# Patient Record
Sex: Male | Born: 1955 | Race: White | Hispanic: No | Marital: Married | Smoking: Former smoker
Health system: Southern US, Community
[De-identification: ages and names within clinical notes are randomized; demographics above are authoritative.]

## PROBLEM LIST (undated history)

## (undated) DIAGNOSIS — G8929 Other chronic pain: Secondary | ICD-10-CM

## (undated) DIAGNOSIS — G5602 Carpal tunnel syndrome, left upper limb: Secondary | ICD-10-CM

## (undated) DIAGNOSIS — M549 Dorsalgia, unspecified: Secondary | ICD-10-CM

## (undated) DIAGNOSIS — K429 Umbilical hernia without obstruction or gangrene: Secondary | ICD-10-CM

## (undated) DIAGNOSIS — R918 Other nonspecific abnormal finding of lung field: Secondary | ICD-10-CM

## (undated) HISTORY — PX: COLONOSCOPY: SHX174

## (undated) HISTORY — PX: INGUINAL HERNIA REPAIR: SUR1180

---

## 2016-07-08 ENCOUNTER — Other Ambulatory Visit: Payer: Self-pay | Admitting: Acute Care

## 2016-07-08 DIAGNOSIS — Z87891 Personal history of nicotine dependence: Secondary | ICD-10-CM

## 2016-08-10 ENCOUNTER — Ambulatory Visit (INDEPENDENT_AMBULATORY_CARE_PROVIDER_SITE_OTHER)
Admission: RE | Admit: 2016-08-10 | Discharge: 2016-08-10 | Disposition: A | Discharge status: Home / Self Care | Payer: Managed Care, Other (non HMO) | Source: Ambulatory Visit | Attending: Acute Care | Admitting: Acute Care

## 2016-08-10 ENCOUNTER — Encounter: Payer: Self-pay | Admitting: Acute Care

## 2016-08-10 ENCOUNTER — Ambulatory Visit (INDEPENDENT_AMBULATORY_CARE_PROVIDER_SITE_OTHER): Payer: Managed Care, Other (non HMO) | Admitting: Acute Care

## 2016-08-10 DIAGNOSIS — Z87891 Personal history of nicotine dependence: Secondary | ICD-10-CM

## 2016-08-10 NOTE — Progress Notes (Signed)
Shared Decision Making Visit Lung Cancer Screening Program [phone]([id number])   Eligibility:  Age 60 y.o.  Pack Years Smoking History Calculation 30 pack years (# packs/per year x # years smoked)  Recent History of coughing up blood  no  Unexplained weight loss? no ( >Than 15 pounds within the last 6 months )  Prior History Lung / other cancer no (Diagnosis within the last 5 years already requiring surveillance chest CT Scans).  Smoking Status Former Smoker  Former Smokers: Years since quit: 12 years  Quit Date: 2005  Visit Components:  Discussion included one or more decision making aids. yes  Discussion included risk/benefits of screening. yes  Discussion included potential follow up diagnostic testing for abnormal scans. yes  Discussion included meaning and risk of over diagnosis. yes  Discussion included meaning and risk of False Positives. yes  Discussion included meaning of total radiation exposure. yes  Counseling Included:  Importance of adherence to annual lung cancer LDCT screening. yes  Impact of comorbidities on ability to participate in the program. yes  Ability and willingness to under diagnostic treatment. yes  Smoking Cessation Counseling:  Current Smokers:   Discussed importance of smoking cessation. NA, former smoker  Information about tobacco cessation classes and interventions provided to patient.NA, former smoker  Patient provided with "ticket" for LDCT Scan. yes  Symptomatic Patient. no  Counseling  Diagnosis Code: Tobacco Use Z72.0  Asymptomatic Patient yes  Counseling Former smoker  Former Smokers:   Discussed the importance of maintaining cigarette abstinence. yes  Diagnosis Code: Personal History of Nicotine Dependence. I69.629Z87.891  Information about tobacco cessation classes and interventions provided to patient. Yes  Patient provided with "ticket" for LDCT Scan. yes  Written Order for Lung Cancer Screening with LDCT placed in  Epic. Yes (CT Chest Lung Cancer Screening Low Dose W/O CM) [id number] Z12.2-Screening of respiratory organs Z87.891-Personal history of nicotine dependence  I spent 20 minutes of face to face time with Mr. Jesus Ochoa discussing the risks and benefits of lung cancer screening. We viewed a power point together that explained in detail the above noted topics. We took the time to pause the power point at intervals to allow for questions to be asked and answered to ensure understanding. We discussed that he had taken the single most powerful action possible to decrease his risk of developing lung cancer when he quit smoking. I counseled him to remain smoke free, and to contact me if he ever had the desire to smoke again so that I can provide resources and tools to help support the effort to remain smoke free. We discussed the time and location of the scan, and that either Jerolyn Shinammy Wilson, CMA or I will call with the results within  24-48 hours of receiving them. He has my card and contact information in the event he needs to speak with me, in addition to a copy of the power point we reviewed as a resource. He verbalized understanding of all of the above and had no further questions upon leaving the office.    Bevelyn NgoSarah F Groce, NP 08/10/2016

## 2016-09-30 ENCOUNTER — Telehealth: Payer: Self-pay | Admitting: Acute Care

## 2016-09-30 DIAGNOSIS — Z87891 Personal history of nicotine dependence: Secondary | ICD-10-CM

## 2016-09-30 NOTE — Telephone Encounter (Signed)
I left a message for Mr. Jesus Ochoa on his cell phone per his request with the results of his low-dose screening CT his scan was read as a  Lung RADS 2: nodules that are benign in appearance and behavior with a very low likelihood of becoming a clinically active cancer due to size or lack of growth. Recommendation per radiology is for a repeat LDCT in 12 months. I explained that we would order and schedule his follow-up scan for August 2018. I also indicated to him that his scan did show signs of aortic atherosclerosis. I explained that this is been a very common finding with these scans. I also explained that due to the fact it is a non-gated exam degree and severity is unable to be determined. At his request I will fax the results to his primary care physician Dr. Catha GosselinKevin Little . Mr. Penne LashLeggett has my contact information in the event he has any further questions regarding his scan.

## 2016-11-09 ENCOUNTER — Ambulatory Visit: Payer: Self-pay | Admitting: Surgery

## 2016-11-09 DIAGNOSIS — K429 Umbilical hernia without obstruction or gangrene: Secondary | ICD-10-CM

## 2016-11-09 DIAGNOSIS — K219 Gastro-esophageal reflux disease without esophagitis: Secondary | ICD-10-CM | POA: Insufficient documentation

## 2016-11-09 DIAGNOSIS — M5416 Radiculopathy, lumbar region: Secondary | ICD-10-CM | POA: Insufficient documentation

## 2016-11-09 DIAGNOSIS — M6208 Separation of muscle (nontraumatic), other site: Secondary | ICD-10-CM

## 2016-11-09 DIAGNOSIS — K439 Ventral hernia without obstruction or gangrene: Secondary | ICD-10-CM | POA: Insufficient documentation

## 2016-11-09 NOTE — H&P (Signed)
Jesus ArgyleDonald Ochoa 11/09/2016 9:41 AM Location: [facility] Patient #: [id number] DOB: 1956-05-11 Married / Language: English / Race: White Male  Patient Care Team: Catha GosselinKevin Little, MD as PCP - General (Family Medicine) Karie SodaSteven Rithvik Orcutt, MD as Consulting Physician (General Surgery) Aletha HalimAndreas Runheim, MD as Consulting Physician (Neurology)   History of Present Illness Jesus Ochoa(Lavette Yankovich C. Valor Quaintance MD; 11/09/2016 10:35 AM) The patient is a 60 year old male who presents with an umbilical hernia. Note for "Umbilical hernia": Patient sent for surgical consultation at the request of Dr[facility] Cl[facility]e [facility]Little [facility]. Concern for umbilical hernia getting symptomatic.  Pleasant overweight active male. Comes in today with his wife. Has had a lump at his bellybutton for the past few years. Hasn't really gotten much larger but now it started to become more painful and uncomfortable. He take some chronic pain medications. Does moderate intense lifting with work. He thinks it may have gotten slightly larger. He recalls having a left inguinal hernia repair as a child but no other abdominal hernia surgeries. He can walk several miles for over an hour without difficulty. He is not smoked in over doesn't years. His wife quit then as well so no smokers at home. No history of skin infections or problems. No problems with constipation or urinary difficulty.  No personal nor family history of GI/colon cancer, inflammatory bowel disease, irritable bowel syndrome, allergy such as Celiac Sprue, dietary/dairy problems, colitis, ulcers nor gastritis. No recent sick contacts/gastroenteritis. No travel outside the country. No changes in diet. No dysphagia to solids or liquids. No significant heartburn or reflux. No hematochezia, hematemesis, coffee ground emesis. No evidence of prior gastric/peptic ulceration. No cardiac or pulmonary issues. No sleep apnea.   Other Problems [name]([name], CMA; 11/09/2016 9:41 AM) Back  Pain Inguinal Hernia  Past Surgical History [name]([name], CMA; 11/09/2016 9:41 AM) Open Inguinal Hernia Surgery Left.  Diagnostic Studies History [name]([name], CMA; 11/09/2016 9:41 AM) Colonoscopy 5-10 years ago  Allergies [name]([name], CMA; 11/09/2016 9:43 AM) Juliette AlcideODEINE  Medication History [name]([name], CMA; 11/09/2016 9:44 AM) Gabapentin (300MG  Tablet, Oral) Active. Hydrocodone-Acetaminophen (5-325MG  Tablet, Oral) Active. Duexis (800-26.6MG  Tablet, Oral) Active. Medications Reconciled  Social History [name]([name], CMA; 11/09/2016 9:41 AM) Alcohol use Occasional alcohol use. Caffeine use Coffee, Tea. No drug use Tobacco use Former smoker.  Family History [name]([name], CMA; 11/09/2016 9:41 AM) Arthritis Mother. Cerebrovascular Accident Father. Hypertension Mother.     Review of Systems (Armen Sherrine MaplesGlenn CMA; 11/09/2016 9:41 AM) General Not Present- Appetite Loss, Chills, Fatigue, Fever, Night Sweats, Weight Gain and Weight Loss. Skin Not Present- Change in Wart/Mole, Dryness, Hives, Jaundice, New Lesions, Non-Healing Wounds, Rash and Ulcer. HEENT Not Present- Earache, Hearing Loss, Hoarseness, Nose Bleed, Oral Ulcers, Ringing in the Ears, Seasonal Allergies, Sinus Pain, Sore Throat, Visual Disturbances, Wears glasses/contact lenses and Yellow Eyes. Respiratory Not Present- Bloody sputum, Chronic Cough, Difficulty Breathing, Snoring and Wheezing. Breast Not Present- Breast Mass, Breast Pain, Nipple Discharge and Skin Changes. Cardiovascular Not Present- Chest Pain, Difficulty Breathing Lying Down, Leg Cramps, Palpitations, Rapid Heart Rate, Shortness of Breath and Swelling of Extremities. Gastrointestinal Not Present- Abdominal Pain, Bloating, Bloody Stool, Change in Bowel Habits, Chronic diarrhea, Constipation, Difficulty Swallowing, Excessive gas, Gets full quickly at meals, Hemorrhoids, Indigestion, Nausea, Rectal Pain and Vomiting. Male Genitourinary Not Present-  Blood in Urine, Change in Urinary Stream, Frequency, Impotence, Nocturia, Painful Urination, Urgency and Urine Leakage. Musculoskeletal Present- Back Pain. Not Present- Joint Pain, Joint Stiffness, Muscle Pain, Muscle Weakness and Swelling of Extremities. Neurological  Not Present- Decreased Memory, Fainting, Headaches, Numbness, Seizures, Tingling, Tremor, Trouble walking and Weakness. Psychiatric Not Present- Anxiety, Bipolar, Change in Sleep Pattern, Depression, Fearful and Frequent crying. Endocrine Not Present- Cold Intolerance, Excessive Hunger, Hair Changes, Heat Intolerance, Hot flashes and New Diabetes. Hematology Not Present- Blood Thinners, Easy Bruising, Excessive bleeding, Gland problems, HIV and Persistent Infections.  Vitals ([name] CMA; 11/09/2016 9:42 AM) 11/09/2016 9:42 AM Weight: 199 lb Height: 71in Body Surface Area: 2.1 m Body Mass Index: 27.75 kg/m  Temp.: 98.67F  Pulse: 80 (Regular)  P.OX: 94% (Room air) BP: 160/90 (Sitting, Left Arm, Standard)      Physical Exam Jesus Sportsman MD; 11/09/2016 10:10 AM)  General Mental Status-Alert. General Appearance-Not in acute distress, Not Sickly. Orientation-Oriented X3. Hydration-Well hydrated. Voice-Normal.  Integumentary Global Assessment Upon inspection and palpation of skin surfaces of the - Axillae: non-tender, no inflammation or ulceration, no drainage. and Distribution of scalp and body hair is normal. General Characteristics Temperature - normal warmth is noted.  Head and Neck Head-normocephalic, atraumatic with no lesions or palpable masses. Face Global Assessment - atraumatic, no absence of expression. Neck Global Assessment - no abnormal movements, no bruit auscultated on the right, no bruit auscultated on the left, no decreased range of motion, non-tender. Trachea-midline. Thyroid Gland Characteristics - non-tender.  Eye Eyeball - Left-Extraocular movements  intact, No Nystagmus. Eyeball - Right-Extraocular movements intact, No Nystagmus. Cornea - Left-No Hazy. Cornea - Right-No Hazy. Sclera/Conjunctiva - Left-No scleral icterus, No Discharge. Sclera/Conjunctiva - Right-No scleral icterus, No Discharge. Pupil - Left-Direct reaction to light normal. Pupil - Right-Direct reaction to light normal.  ENMT Ears Pinna - Left - no drainage observed, no generalized tenderness observed. Right - no drainage observed, no generalized tenderness observed. Nose and Sinuses External Inspection of the Nose - no destructive lesion observed. Inspection of the nares - Left - quiet respiration. Right - quiet respiration. Mouth and Throat Lips - Upper Lip - no fissures observed, no pallor noted. Lower Lip - no fissures observed, no pallor noted. Nasopharynx - no discharge present. Oral Cavity/Oropharynx - Tongue - no dryness observed. Oral Mucosa - no cyanosis observed. Hypopharynx - no evidence of airway distress observed.  Chest and Lung Exam Inspection Movements - Normal and Symmetrical. Accessory muscles - No use of accessory muscles in breathing. Palpation Palpation of the chest reveals - Non-tender. Auscultation Breath sounds - Normal and Clear.  Cardiovascular Auscultation Rhythm - Regular. Murmurs & Other Heart Sounds - Auscultation of the heart reveals - No Murmurs and No Systolic Clicks.  Abdomen Inspection Inspection of the abdomen reveals - No Visible peristalsis and No Abnormal pulsations. Umbilicus - No Bleeding, No Urine drainage. Palpation/Percussion Palpation and Percussion of the abdomen reveal - Soft, Non Tender, No Rebound tenderness, No Rigidity (guarding) and No Cutaneous hyperesthesia. Note: Moderate supraumbilical diastases recti. 2-1/2 cm umbilical hernia reducible. Abdomen soft. Nontender, nondistended. No guarding. No other hernias  Male Genitourinary Sexual Maturity Tanner 5 - Adult hair pattern and Adult penile  size and shape. Note: High riding left testicle but no recurrent inguinal hernia. I felt a bulge in supine but no hernia standing with Valsalva in right groin. No inguinal hernias. Normal external genitalia. Epididymi, testes, and spermatic cords normal without any masses.  Peripheral Vascular Upper Extremity Inspection - Left - No Cyanotic nailbeds, Not Ischemic. Right - No Cyanotic nailbeds, Not Ischemic.  Neurologic Neurologic evaluation reveals -normal attention span and ability to concentrate, able to name objects and repeat phrases. Appropriate fund  of knowledge , normal sensation and normal coordination. Mental Status Affect - not angry, not paranoid. Cranial Nerves-Normal Bilaterally. Gait-Normal.  Neuropsychiatric Mental status exam performed with findings of-able to articulate well with normal speech/language, rate, volume and coherence, thought content normal with ability to perform basic computations and apply abstract reasoning and no evidence of hallucinations, delusions, obsessions or homicidal/suicidal ideation.  Musculoskeletal Global Assessment Spine, Ribs and Pelvis - no instability, subluxation or laxity. Right Upper Extremity - no instability, subluxation or laxity.  Lymphatic Head & Neck  General Head & Neck Lymphatics: Bilateral - Description - No Localized lymphadenopathy. Axillary  General Axillary Region: Bilateral - Description - No Localized lymphadenopathy. Femoral & Inguinal  Generalized Femoral & Inguinal Lymphatics: Left - Description - No Localized lymphadenopathy. Right - Description - No Localized lymphadenopathy.    Assessment & Plan Jesus Ochoa(Hazael Olveda C. Tayla Panozzo MD; 11/09/2016 10:09 AM)  UMBILICAL HERNIA WITHOUT OBSTRUCTION AND WITHOUT GANGRENE (K42.9) Impression: To have by 2 cm umbilical hernia with moderate diastases recti.  I think he would benefit from hernia repair. We do have laparoscopic underlay repair with mesh given the moderate size,  diastases recti, and significant weight lifting requirements. A more durable repair.  Discussed with the patient and his wife. They're interesting proceeding electively.  DIASTASIS RECTI (M62.08)  Current Plans Pt Education - [facility] Diastasis Recti: discussed with patient and provided information. PREOP - [facility] - ENCOUNTER FOR PREOPERATIVE EXAMINATION FOR GENERAL SURGICAL PROCEDURE (Z01.818)  Current Plans You are being scheduled for surgery - Our schedulers will call you.  You should hear from our office's scheduling department within 5 working days about the location, date, and time of surgery. We try to make accommodations for patient's preferences in scheduling surgery, but sometimes the OR schedule or the surgeon's schedule prevents us from making those accommodations.  If you have not heard from our office [phone]) in 5 working days, call the office and ask for your surgeon's nurse.  If you have other questions about your diagnosis, plan, or surgery, call the office and ask for your surgeon's nurse.  Written instructions provided The anatomy & physiology of the abdominal wall was discussed. The pathophysiology of hernias was discussed. Natural history risks without surgery including progeressive enlargement, pain, incarceration, & strangulation was discussed. Contributors to complications such as smoking, obesity, diabetes, prior surgery, etc were discussed.  I feel the risks of no intervention will lead to serious problems that outweigh the operative risks; therefore, I recommended surgery to reduce and repair the hernia. I explained laparoscopic techniques with possible need for an open approach. I noted the probable use of mesh to patch and/or buttress the hernia repair  Risks such as bleeding, infection, abscess, need for further treatment, heart attack, death, and other risks were discussed. I noted a good likelihood this will help address the problem. Goals of post-operative  recovery were discussed as well. Possibility that this will not correct all symptoms was explained. I stressed the importance of low-impact activity, aggressive pain control, avoiding constipation, & not pushing through pain to minimize risk of post-operative chronic pain or injury. Possibility of reherniation especially with smoking, obesity, diabetes, immunosuppression, and other health conditions was discussed. We will work to minimize complications.  An educational handout further explaining the pathology & treatment options was given as well. Questions were answered. The patient expresses understanding & wishes to proceed with surgery.  Pt Education - [facility] Hernia Post-Op HCI (Elianis Fischbach): discussed with patient and provided information. Pt Education - [facility] Pain Control (  Eann Cleland) Pt Education - Pamphlet Given - Laparoscopic Hernia Repair: discussed with patient and provided information.  Jesus Ochoa, M.D., F.A.C.S. Gastrointestinal and Minimally Invasive Surgery [facility] [address] [place], [place] [address] [phone] Main / Paging

## 2016-12-29 ENCOUNTER — Encounter (HOSPITAL_BASED_OUTPATIENT_CLINIC_OR_DEPARTMENT_OTHER): Payer: Self-pay | Admitting: *Deleted

## 2016-12-29 NOTE — Progress Notes (Signed)
SPOKE W/ PT'S WIFE.  NPO AFTER MN.  ARRIVE AT 0800.  NEEDS HG.  WILL DO HIBICLENS SHOWER HS BEFORE AND AM DOS W/ SIPS OF WATER.

## 2016-12-31 ENCOUNTER — Ambulatory Visit (HOSPITAL_BASED_OUTPATIENT_CLINIC_OR_DEPARTMENT_OTHER): Payer: Managed Care, Other (non HMO) | Admitting: Anesthesiology

## 2016-12-31 ENCOUNTER — Ambulatory Visit (HOSPITAL_BASED_OUTPATIENT_CLINIC_OR_DEPARTMENT_OTHER)
Admission: RE | Admit: 2016-12-31 | Discharge: 2016-12-31 | Disposition: A | Discharge status: Home / Self Care | Payer: Managed Care, Other (non HMO) | Source: Ambulatory Visit | Attending: Surgery | Admitting: Surgery

## 2016-12-31 ENCOUNTER — Encounter (HOSPITAL_BASED_OUTPATIENT_CLINIC_OR_DEPARTMENT_OTHER): Payer: Self-pay | Admitting: Anesthesiology

## 2016-12-31 ENCOUNTER — Encounter (HOSPITAL_BASED_OUTPATIENT_CLINIC_OR_DEPARTMENT_OTHER)
Admission: RE | Disposition: A | Discharge status: Home / Self Care | Payer: Self-pay | Source: Ambulatory Visit | Attending: Surgery

## 2016-12-31 DIAGNOSIS — E663 Overweight: Secondary | ICD-10-CM | POA: Insufficient documentation

## 2016-12-31 DIAGNOSIS — Z6824 Body mass index (BMI) 24.0-24.9, adult: Secondary | ICD-10-CM | POA: Diagnosis not present

## 2016-12-31 DIAGNOSIS — K429 Umbilical hernia without obstruction or gangrene: Secondary | ICD-10-CM | POA: Insufficient documentation

## 2016-12-31 DIAGNOSIS — Z87891 Personal history of nicotine dependence: Secondary | ICD-10-CM | POA: Diagnosis not present

## 2016-12-31 DIAGNOSIS — K439 Ventral hernia without obstruction or gangrene: Secondary | ICD-10-CM | POA: Diagnosis present

## 2016-12-31 HISTORY — DX: Carpal tunnel syndrome, left upper limb: G56.02

## 2016-12-31 HISTORY — DX: Umbilical hernia without obstruction or gangrene: K42.9

## 2016-12-31 HISTORY — PX: INSERTION OF MESH: SHX5868

## 2016-12-31 HISTORY — DX: Other nonspecific abnormal finding of lung field: R91.8

## 2016-12-31 HISTORY — DX: Other chronic pain: G89.29

## 2016-12-31 HISTORY — PX: VENTRAL HERNIA REPAIR: SHX424

## 2016-12-31 HISTORY — DX: Dorsalgia, unspecified: M54.9

## 2016-12-31 LAB — POCT HEMOGLOBIN-HEMACUE: HEMOGLOBIN: 14.7 g/dL (ref 13.0–17.0)

## 2016-12-31 SURGERY — REPAIR, HERNIA, VENTRAL, LAPAROSCOPIC
Anesthesia: General | Site: Abdomen

## 2016-12-31 MED ORDER — CELECOXIB 200 MG PO CAPS
ORAL_CAPSULE | ORAL | Status: AC
  Filled 2016-12-31: qty 2

## 2016-12-31 MED ORDER — CEFAZOLIN SODIUM-DEXTROSE 2-4 GM/100ML-% IV SOLN
2.0000 g | INTRAVENOUS | Status: AC
  Administered 2016-12-31: 2 g via INTRAVENOUS
  Filled 2016-12-31: qty 100

## 2016-12-31 MED ORDER — OXYCODONE HCL 5 MG PO TABS: 5.0000 mg | ORAL_TABLET | ORAL | 0 refills | Status: AC | PRN

## 2016-12-31 MED ORDER — EPHEDRINE SULFATE-NACL 50-0.9 MG/10ML-% IV SOSY
PREFILLED_SYRINGE | INTRAVENOUS | Status: DC | PRN
  Administered 2016-12-31 (×2): 10 mg via INTRAVENOUS

## 2016-12-31 MED ORDER — EPINEPHRINE PF 1 MG/ML IJ SOLN
INTRAMUSCULAR | Status: AC
  Filled 2016-12-31: qty 1

## 2016-12-31 MED ORDER — OXYCODONE HCL 5 MG PO TABS
5.0000 mg | ORAL_TABLET | Freq: Once | ORAL | Status: AC
  Administered 2016-12-31: 5 mg via ORAL
  Filled 2016-12-31: qty 1

## 2016-12-31 MED ORDER — DEXAMETHASONE SODIUM PHOSPHATE 4 MG/ML IJ SOLN
INTRAMUSCULAR | Status: DC | PRN
  Administered 2016-12-31: 10 mg via INTRAVENOUS

## 2016-12-31 MED ORDER — GABAPENTIN 300 MG PO CAPS
ORAL_CAPSULE | ORAL | Status: AC
  Filled 2016-12-31: qty 1

## 2016-12-31 MED ORDER — SUCCINYLCHOLINE CHLORIDE 200 MG/10ML IV SOSY
PREFILLED_SYRINGE | INTRAVENOUS | Status: AC
  Filled 2016-12-31: qty 10

## 2016-12-31 MED ORDER — LIDOCAINE 2% (20 MG/ML) 5 ML SYRINGE
INTRAMUSCULAR | Status: AC
  Filled 2016-12-31: qty 5

## 2016-12-31 MED ORDER — SUGAMMADEX SODIUM 200 MG/2ML IV SOLN
INTRAVENOUS | Status: AC
  Filled 2016-12-31: qty 2

## 2016-12-31 MED ORDER — FENTANYL CITRATE (PF) 100 MCG/2ML IJ SOLN
INTRAMUSCULAR | Status: DC | PRN
  Administered 2016-12-31 (×4): 50 ug via INTRAVENOUS

## 2016-12-31 MED ORDER — HYDROMORPHONE HCL 1 MG/ML IJ SOLN
0.2500 mg | INTRAMUSCULAR | Status: DC | PRN
  Filled 2016-12-31: qty 0.5

## 2016-12-31 MED ORDER — SODIUM CHLORIDE 0.9 % IJ SOLN
INTRAMUSCULAR | Status: AC
  Filled 2016-12-31: qty 50

## 2016-12-31 MED ORDER — ROCURONIUM BROMIDE 50 MG/5ML IV SOSY
PREFILLED_SYRINGE | INTRAVENOUS | Status: DC | PRN
  Administered 2016-12-31: 50 mg via INTRAVENOUS

## 2016-12-31 MED ORDER — BUPIVACAINE-EPINEPHRINE 0.25% -1:200000 IJ SOLN
INTRAMUSCULAR | Status: DC | PRN
  Administered 2016-12-31: 10 mL
  Administered 2016-12-31: 20 mL

## 2016-12-31 MED ORDER — SUGAMMADEX SODIUM 200 MG/2ML IV SOLN
INTRAVENOUS | Status: DC | PRN
  Administered 2016-12-31: 200 mg via INTRAVENOUS

## 2016-12-31 MED ORDER — MEPERIDINE HCL 25 MG/ML IJ SOLN
6.2500 mg | INTRAMUSCULAR | Status: DC | PRN
  Filled 2016-12-31: qty 1

## 2016-12-31 MED ORDER — FENTANYL CITRATE (PF) 100 MCG/2ML IJ SOLN
INTRAMUSCULAR | Status: AC
  Filled 2016-12-31: qty 2

## 2016-12-31 MED ORDER — PROMETHAZINE HCL 25 MG/ML IJ SOLN
6.2500 mg | INTRAMUSCULAR | Status: DC | PRN
  Filled 2016-12-31: qty 1

## 2016-12-31 MED ORDER — DEXAMETHASONE SODIUM PHOSPHATE 10 MG/ML IJ SOLN
INTRAMUSCULAR | Status: AC
  Filled 2016-12-31: qty 1

## 2016-12-31 MED ORDER — ACETAMINOPHEN 500 MG PO TABS
1000.0000 mg | ORAL_TABLET | ORAL | Status: AC
  Administered 2016-12-31: 1000 mg via ORAL
  Filled 2016-12-31: qty 2

## 2016-12-31 MED ORDER — MIDAZOLAM HCL 2 MG/2ML IJ SOLN
INTRAMUSCULAR | Status: AC
  Filled 2016-12-31: qty 2

## 2016-12-31 MED ORDER — ACETAMINOPHEN 500 MG PO TABS
ORAL_TABLET | ORAL | Status: AC
  Filled 2016-12-31: qty 2

## 2016-12-31 MED ORDER — HYDROCODONE-ACETAMINOPHEN 7.5-325 MG PO TABS
1.0000 | ORAL_TABLET | Freq: Once | ORAL | Status: DC | PRN
Start: 2016-12-31 — End: 2016-12-31
  Filled 2016-12-31: qty 1

## 2016-12-31 MED ORDER — GABAPENTIN 300 MG PO CAPS
300.0000 mg | ORAL_CAPSULE | ORAL | Status: AC
  Administered 2016-12-31: 300 mg via ORAL
  Filled 2016-12-31: qty 1

## 2016-12-31 MED ORDER — MIDAZOLAM HCL 5 MG/5ML IJ SOLN
INTRAMUSCULAR | Status: DC | PRN
  Administered 2016-12-31: 2 mg via INTRAVENOUS

## 2016-12-31 MED ORDER — EPHEDRINE 5 MG/ML INJ
INTRAVENOUS | Status: AC
Start: 2016-12-31 — End: 2016-12-31
  Filled 2016-12-31: qty 10

## 2016-12-31 MED ORDER — ROCURONIUM BROMIDE 50 MG/5ML IV SOSY
PREFILLED_SYRINGE | INTRAVENOUS | Status: AC
  Filled 2016-12-31: qty 5

## 2016-12-31 MED ORDER — BUPIVACAINE LIPOSOME 1.3 % IJ SUSP
INTRAMUSCULAR | Status: AC
Start: 2016-12-31 — End: 2016-12-31
  Filled 2016-12-31: qty 20

## 2016-12-31 MED ORDER — CELECOXIB 400 MG PO CAPS
400.0000 mg | ORAL_CAPSULE | ORAL | Status: AC
  Administered 2016-12-31: 400 mg via ORAL
  Filled 2016-12-31: qty 1

## 2016-12-31 MED ORDER — CHLORHEXIDINE GLUCONATE CLOTH 2 % EX PADS
6.0000 | MEDICATED_PAD | Freq: Once | CUTANEOUS | Status: DC
  Filled 2016-12-31: qty 6

## 2016-12-31 MED ORDER — ARTIFICIAL TEARS OP OINT
TOPICAL_OINTMENT | OPHTHALMIC | Status: AC
  Filled 2016-12-31: qty 3.5

## 2016-12-31 MED ORDER — OXYCODONE HCL 5 MG PO TABS
ORAL_TABLET | ORAL | Status: AC
  Filled 2016-12-31: qty 1

## 2016-12-31 MED ORDER — ONDANSETRON HCL 4 MG/2ML IJ SOLN
INTRAMUSCULAR | Status: AC
  Filled 2016-12-31: qty 2

## 2016-12-31 MED ORDER — CEFAZOLIN SODIUM-DEXTROSE 2-4 GM/100ML-% IV SOLN
INTRAVENOUS | Status: AC
  Filled 2016-12-31: qty 100

## 2016-12-31 MED ORDER — BUPIVACAINE HCL (PF) 0.25 % IJ SOLN
INTRAMUSCULAR | Status: AC
  Filled 2016-12-31: qty 60

## 2016-12-31 MED ORDER — PROPOFOL 10 MG/ML IV BOLUS
INTRAVENOUS | Status: DC | PRN
  Administered 2016-12-31: 200 mg via INTRAVENOUS

## 2016-12-31 MED ORDER — LIDOCAINE HCL (CARDIAC) 20 MG/ML IV SOLN
INTRAVENOUS | Status: DC | PRN
  Administered 2016-12-31: 60 mg via INTRAVENOUS

## 2016-12-31 MED ORDER — PROPOFOL 10 MG/ML IV BOLUS
INTRAVENOUS | Status: AC
  Filled 2016-12-31: qty 40

## 2016-12-31 MED ORDER — ONDANSETRON HCL 4 MG/2ML IJ SOLN
INTRAMUSCULAR | Status: DC | PRN
  Administered 2016-12-31: 4 mg via INTRAVENOUS

## 2016-12-31 MED ORDER — KETOROLAC TROMETHAMINE 30 MG/ML IJ SOLN
INTRAMUSCULAR | Status: AC
  Filled 2016-12-31: qty 1

## 2016-12-31 MED ORDER — LIDOCAINE HCL 4 % MT SOLN
OROMUCOSAL | Status: DC | PRN
  Administered 2016-12-31: 2 mL via TOPICAL

## 2016-12-31 MED ORDER — SODIUM CHLORIDE 0.9 % IJ SOLN
INTRAMUSCULAR | Status: DC | PRN
  Administered 2016-12-31: 40 mL

## 2016-12-31 MED ORDER — BUPIVACAINE LIPOSOME 1.3 % IJ SUSP
INTRAMUSCULAR | Status: DC | PRN
  Administered 2016-12-31: 20 mL

## 2016-12-31 MED ORDER — LACTATED RINGERS IV SOLN
INTRAVENOUS | Status: DC
  Administered 2016-12-31 (×2): via INTRAVENOUS
  Filled 2016-12-31: qty 1000

## 2016-12-31 SURGICAL SUPPLY — 63 items
APPLIER CLIP 5 13 M/L LIGAMAX5 (MISCELLANEOUS)
BINDER ABDOMINAL 12 ML 46-62 (SOFTGOODS) ×4 IMPLANT
BLADE SURG 11 STRL SS (BLADE) ×4 IMPLANT
CABLE HIGH FREQUENCY MONO STRZ (ELECTRODE) ×4 IMPLANT
CANISTER SUCTION 2500CC (MISCELLANEOUS) IMPLANT
CHLORAPREP W/TINT 26ML (MISCELLANEOUS) ×4 IMPLANT
CLIP APPLIE 5 13 M/L LIGAMAX5 (MISCELLANEOUS) IMPLANT
CLOSURE WOUND 1/2 X4 (GAUZE/BANDAGES/DRESSINGS) ×1
COVER BACK TABLE 60X90IN (DRAPES) ×4 IMPLANT
COVER MAYO STAND STRL (DRAPES) ×4 IMPLANT
DECANTER SPIKE VIAL GLASS SM (MISCELLANEOUS) IMPLANT
DEVICE SECURE STRAP 25 ABSORB (INSTRUMENTS) ×4 IMPLANT
DEVICE TROCAR PUNCTURE CLOSURE (ENDOMECHANICALS) ×4 IMPLANT
DRAPE LAPAROSCOPIC ABDOMINAL (DRAPES) ×4 IMPLANT
DRAPE UTILITY XL STRL (DRAPES) ×4 IMPLANT
DRAPE WARM FLUID 44X44 (DRAPE) ×4 IMPLANT
DRSG TEGADERM 2-3/8X2-3/4 SM (GAUZE/BANDAGES/DRESSINGS) ×12 IMPLANT
DRSG TEGADERM 4X4.75 (GAUZE/BANDAGES/DRESSINGS) ×4 IMPLANT
ELECT REM PT RETURN 9FT ADLT (ELECTROSURGICAL) ×4
ELECTRODE REM PT RTRN 9FT ADLT (ELECTROSURGICAL) ×2 IMPLANT
GLOVE BIO SURGEON STRL SZ 6.5 (GLOVE) ×3 IMPLANT
GLOVE BIO SURGEONS STRL SZ 6.5 (GLOVE) ×1
GLOVE BIOGEL PI IND STRL 6.5 (GLOVE) ×2 IMPLANT
GLOVE BIOGEL PI IND STRL 7.0 (GLOVE) ×2 IMPLANT
GLOVE BIOGEL PI IND STRL 7.5 (GLOVE) ×4 IMPLANT
GLOVE BIOGEL PI INDICATOR 6.5 (GLOVE) ×2
GLOVE BIOGEL PI INDICATOR 7.0 (GLOVE) ×2
GLOVE BIOGEL PI INDICATOR 7.5 (GLOVE) ×4
GLOVE ECLIPSE 8.0 STRL XLNG CF (GLOVE) ×4 IMPLANT
GLOVE INDICATOR 8.0 STRL GRN (GLOVE) IMPLANT
GOWN STRL REUS W/TWL XL LVL3 (GOWN DISPOSABLE) ×12 IMPLANT
IRRIG SUCT STRYKERFLOW 2 WTIP (MISCELLANEOUS)
IRRIGATION SUCT STRKRFLW 2 WTP (MISCELLANEOUS) IMPLANT
KIT ROOM TURNOVER WOR (KITS) ×4 IMPLANT
MARKER SKIN DUAL TIP RULER LAB (MISCELLANEOUS) IMPLANT
MESH VENTRALIGHT ST 6X8 (Mesh Specialty) ×2 IMPLANT
MESH VENTRLGHT ELLIPSE 8X6XMFL (Mesh Specialty) ×2 IMPLANT
NEEDLE HYPO 22GX1.5 SAFETY (NEEDLE) ×4 IMPLANT
NEEDLE SPNL 22GX3.5 QUINCKE BK (NEEDLE) ×4 IMPLANT
NS IRRIG 500ML POUR BTL (IV SOLUTION) ×4 IMPLANT
PACK BASIN DAY SURGERY FS (CUSTOM PROCEDURE TRAY) ×4 IMPLANT
PAD POSITIONING PINK XL (MISCELLANEOUS) ×4 IMPLANT
SCISSORS LAP 5X35 DISP (ENDOMECHANICALS) ×4 IMPLANT
SET IRRIG TUBING LAPAROSCOPIC (IRRIGATION / IRRIGATOR) IMPLANT
SHEARS HARMONIC ACE PLUS 36CM (ENDOMECHANICALS) IMPLANT
SLEEVE ADV FIXATION 5X100MM (TROCAR) IMPLANT
SPONGE GAUZE 2X2 8PLY STER LF (GAUZE/BANDAGES/DRESSINGS) ×1
SPONGE GAUZE 2X2 8PLY STRL LF (GAUZE/BANDAGES/DRESSINGS) ×3 IMPLANT
STRIP CLOSURE SKIN 1/2X4 (GAUZE/BANDAGES/DRESSINGS) ×3 IMPLANT
SUT MNCRL AB 4-0 PS2 18 (SUTURE) ×4 IMPLANT
SUT PDS AB 1 CT1 27 (SUTURE) ×12 IMPLANT
SUT PROLENE 1 CT 1 30 (SUTURE) ×24 IMPLANT
SUT VIC AB 2-0 SH 27 (SUTURE)
SUT VIC AB 2-0 SH 27X BRD (SUTURE) IMPLANT
SYR 20CC LL (SYRINGE) ×8 IMPLANT
TACKER 5MM HERNIA 3.5CML NAB (ENDOMECHANICALS) IMPLANT
TOWEL OR 17X24 6PK STRL BLUE (TOWEL DISPOSABLE) ×8 IMPLANT
TROCAR BLADELESS OPT 5 100 (ENDOMECHANICALS) ×12 IMPLANT
TROCAR XCEL NON-BLD 11X100MML (ENDOMECHANICALS) IMPLANT
TUBE CONNECTING 12'X1/4 (SUCTIONS)
TUBE CONNECTING 12X1/4 (SUCTIONS) IMPLANT
TUBING INSUF HEATED (TUBING) ×4 IMPLANT
WATER STERILE IRR 500ML POUR (IV SOLUTION) ×4 IMPLANT

## 2016-12-31 NOTE — Interval H&P Note (Signed)
History and Physical Interval Note:  12/31/2016 9:38 AM  Jesus Ochoa  has presented today for surgery, with the diagnosis of UMBILICAL VENTRAL WALL HERNIA  The various methods of treatment have been discussed with the patient and family. After consideration of risks, benefits and other options for treatment, the patient has consented to  Procedure(s): LAPAROSCOPIC UMBILICAL / VENTRAL WALL HERNIA WITH MESH/ (N/A) INSERTION OF MESH (N/A) as a surgical intervention .  The patient's history has been reviewed, patient examined, no change in status, stable for surgery.  I have reviewed the patient's chart and labs.  Questions were answered to the patient's satisfaction.     Maree Ainley C.

## 2016-12-31 NOTE — H&P (Signed)
Jesus Ochoa 11/09/2016 9:41 AM Location: [facility] Patient #: [id number] DOB: [birth year] Married / Language: English / Race: White Male  Patient Care Team: Catha Gosselin, MD as PCP - General (Family Medicine) Karie Soda, MD as Consulting Physician (General Surgery) Aletha Halim, MD as Consulting Physician (Neurology)   History of Present Illness The patient is a 61 year old male who presents with an umbilical hernia. Note for "Umbilical hernia": Patient sent for surgical consultation at the request of Dr. Clarene Duke was [facility] physicians. Concern for umbilical hernia getting symptomatic.  Pleasant overweight active male. Comes in today with his wife. Has had a lump at his bellybutton for the past few years. Hasn't really gotten much larger but now it started to become more painful and uncomfortable. He take some chronic pain medications. Does moderate intense lifting with work. He thinks it may have gotten slightly larger. He recalls having a left inguinal hernia repair as a child but no other abdominal hernia surgeries. He can walk several miles for over an hour without difficulty. He is not smoked in over doesn't years. His wife quit then as well so no smokers at home. No history of skin infections or problems. No problems with constipation or urinary difficulty.  No personal nor family history of GI/colon cancer, inflammatory bowel disease, irritable bowel syndrome, allergy such as Celiac Sprue, dietary/dairy problems, colitis, ulcers nor gastritis. No recent sick contacts/gastroenteritis. No travel outside the country. No changes in diet. No dysphagia to solids or liquids. No significant heartburn or reflux. No hematochezia, hematemesis, coffee ground emesis. No evidence of prior gastric/peptic ulceration. No cardiac or pulmonary issues. No sleep apnea.  No new events.  Ready for surgery   Other Problems [name], CMA; 11/09/2016 9:41 AM) Back  Pain  Inguinal Hernia   Past Surgical History [name], CMA; 11/09/2016 9:41 AM) Open Inguinal Hernia Surgery  Left.  Diagnostic Studies History [name], CMA; 11/09/2016 9:41 AM) Colonoscopy  5-10 years ago  Allergies [name], CMA; 11/09/2016 9:43 AM) Juliette Alcide   Medication History [name], CMA; 11/09/2016 9:44 AM) Gabapentin (300MG  Tablet, Oral) Active. Hydrocodone-Acetaminophen (5-325MG  Tablet, Oral) Active. Duexis (800-26.6MG  Tablet, Oral) Active. Medications Reconciled  Social History [name], CMA; 11/09/2016 9:41 AM) Alcohol use  Occasional alcohol use. Caffeine use  Coffee, Tea. No drug use  Tobacco use  Former smoker.  Family History [name], CMA; 11/09/2016 9:41 AM) Arthritis  Mother. Cerebrovascular Accident  Father. Hypertension  Mother.    Review of Systems (Armen Sherrine Maples CMA; 11/09/2016 9:41 AM) General Not Present- Appetite Loss, Chills, Fatigue, Fever, Night Sweats, Weight Gain and Weight Loss. Skin Not Present- Change in Wart/Mole, Dryness, Hives, Jaundice, New Lesions, Non-Healing Wounds, Rash and Ulcer. HEENT Not Present- Earache, Hearing Loss, Hoarseness, Nose Bleed, Oral Ulcers, Ringing in the Ears, Seasonal Allergies, Sinus Pain, Sore Throat, Visual Disturbances, Wears glasses/contact lenses and Yellow Eyes. Respiratory Not Present- Bloody sputum, Chronic Cough, Difficulty Breathing, Snoring and Wheezing. Breast Not Present- Breast Mass, Breast Pain, Nipple Discharge and Skin Changes. Cardiovascular Not Present- Chest Pain, Difficulty Breathing Lying Down, Leg Cramps, Palpitations, Rapid Heart Rate, Shortness of Breath and Swelling of Extremities. Gastrointestinal Not Present- Abdominal Pain, Bloating, Bloody Stool, Change in Bowel Habits, Chronic diarrhea, Constipation, Difficulty Swallowing, Excessive gas, Gets full quickly at meals, Hemorrhoids, Indigestion, Nausea, Rectal Pain and Vomiting. Male Genitourinary Not  Present- Blood in Urine, Change in Urinary Stream, Frequency, Impotence, Nocturia, Painful Urination, Urgency and Urine Leakage. Musculoskeletal Present- Back Pain. Not Present- Joint  Pain, Joint Stiffness, Muscle Pain, Muscle Weakness and Swelling of Extremities. Neurological Not Present- Decreased Memory, Fainting, Headaches, Numbness, Seizures, Tingling, Tremor, Trouble walking and Weakness. Psychiatric Not Present- Anxiety, Bipolar, Change in Sleep Pattern, Depression, Fearful and Frequent crying. Endocrine Not Present- Cold Intolerance, Excessive Hunger, Hair Changes, Heat Intolerance, Hot flashes and New Diabetes. Hematology Not Present- Blood Thinners, Easy Bruising, Excessive bleeding, Gland problems, HIV and Persistent Infections.  Vitals ([name] CMA; 11/09/2016 9:42 AM) 11/09/2016 9:42 AM Weight: 199 lb Height: 71in Body Surface Area: 2.1 m Body Mass Index: 27.75 kg/m  Temp.: 98.61F  Pulse: 80 (Regular)  P.OX: 94% (Room air) BP: 160/90 (Sitting, Left Arm, Standard)       Physical Exam Ardeth Sportsman(Giovoni Bunch C. Khayman Kirsch MD; 11/09/2016 10:10 AM) General Mental Status-Alert. General Appearance-Not in acute distress, Not Sickly. Orientation-Oriented X3. Hydration-Well hydrated. Voice-Normal.  Integumentary Global Assessment Upon inspection and palpation of skin surfaces of the - Axillae: non-tender, no inflammation or ulceration, no drainage. and Distribution of scalp and body hair is normal. General Characteristics Temperature - normal warmth is noted.  Head and Neck Head-normocephalic, atraumatic with no lesions or palpable masses. Face Global Assessment - atraumatic, no absence of expression. Neck Global Assessment - no abnormal movements, no bruit auscultated on the right, no bruit auscultated on the left, no decreased range of motion, non-tender. Trachea-midline. Thyroid Gland Characteristics - non-tender.  Eye Eyeball - Left-Extraocular  movements intact, No Nystagmus. Eyeball - Right-Extraocular movements intact, No Nystagmus. Cornea - Left-No Hazy. Cornea - Right-No Hazy. Sclera/Conjunctiva - Left-No scleral icterus, No Discharge. Sclera/Conjunctiva - Right-No scleral icterus, No Discharge. Pupil - Left-Direct reaction to light normal. Pupil - Right-Direct reaction to light normal.  ENMT Ears Pinna - Left - no drainage observed, no generalized tenderness observed. Right - no drainage observed, no generalized tenderness observed. Nose and Sinuses External Inspection of the Nose - no destructive lesion observed. Inspection of the nares - Left - quiet respiration. Right - quiet respiration. Mouth and Throat Lips - Upper Lip - no fissures observed, no pallor noted. Lower Lip - no fissures observed, no pallor noted. Nasopharynx - no discharge present. Oral Cavity/Oropharynx - Tongue - no dryness observed. Oral Mucosa - no cyanosis observed. Hypopharynx - no evidence of airway distress observed.  Chest and Lung Exam Inspection Movements - Normal and Symmetrical. Accessory muscles - No use of accessory muscles in breathing. Palpation Palpation of the chest reveals - Non-tender. Auscultation Breath sounds - Normal and Clear.  Cardiovascular Auscultation Rhythm - Regular. Murmurs & Other Heart Sounds - Auscultation of the heart reveals - No Murmurs and No Systolic Clicks.  Abdomen Inspection Inspection of the abdomen reveals - No Visible peristalsis and No Abnormal pulsations. Umbilicus - No Bleeding, No Urine drainage. Palpation/Percussion Palpation and Percussion of the abdomen reveal - Soft, Non Tender, No Rebound tenderness, No Rigidity (guarding) and No Cutaneous hyperesthesia. Note: Moderate supraumbilical diastases recti. 2-1/2 cm umbilical hernia reducible. Abdomen soft. Nontender, nondistended. No guarding. No other hernias   Male Genitourinary Sexual Maturity Tanner 5 - Adult hair pattern and  Adult penile size and shape. Note: High riding left testicle but no recurrent inguinal hernia. I felt a bulge in supine but no hernia standing with Valsalva in right groin. No inguinal hernias. Normal external genitalia. Epididymi, testes, and spermatic cords normal without any masses.   Peripheral Vascular Upper Extremity Inspection - Left - No Cyanotic nailbeds, Not Ischemic. Right - No Cyanotic nailbeds, Not Ischemic.  Neurologic Neurologic evaluation reveals -normal attention  span and ability to concentrate, able to name objects and repeat phrases. Appropriate fund of knowledge , normal sensation and normal coordination. Mental Status Affect - not angry, not paranoid. Cranial Nerves-Normal Bilaterally. Gait-Normal.  Neuropsychiatric Mental status exam performed with findings of-able to articulate well with normal speech/language, rate, volume and coherence, thought content normal with ability to perform basic computations and apply abstract reasoning and no evidence of hallucinations, delusions, obsessions or homicidal/suicidal ideation.  Musculoskeletal Global Assessment Spine, Ribs and Pelvis - no instability, subluxation or laxity. Right Upper Extremity - no instability, subluxation or laxity.  Lymphatic Head & Neck  General Head & Neck Lymphatics: Bilateral - Description - No Localized lymphadenopathy. Axillary  General Axillary Region: Bilateral - Description - No Localized lymphadenopathy. Femoral & Inguinal  Generalized Femoral & Inguinal Lymphatics: Left - Description - No Localized lymphadenopathy. Right - Description - No Localized lymphadenopathy.    Assessment & Plan  UMBILICAL HERNIA WITHOUT OBSTRUCTION AND WITHOUT GANGRENE (K42.9) Impression: To have by 2 cm umbilical hernia with moderate diastases recti.  I think he would benefit from hernia repair. We do have laparoscopic underlay repair with mesh given the moderate size, diastases recti, and  significant weight lifting requirements. A more durable repair.  Discussed with the patient and his wife. They're interesting proceeding electively. I have re-reviewed the the patient's records, history, medications, and allergies.  I have re-examined the patient.  I again discussed intraoperative plans and goals of post-operative recovery.  The patient agrees to proceed.  Eban Weick  12-29-1955 [id number]  Patient Care Team: Catha Gosselin, MD as PCP - General (Family Medicine) Karie Soda, MD as Consulting Physician (General Surgery) Aletha Halim, MD as Consulting Physician (Neurology)  Patient Active Problem List   Diagnosis Date Noted  . Umbilical hernia 11/09/2016  . Diastasis recti 11/09/2016  . Lumbar radiculopathy 11/09/2016  . GERD (gastroesophageal reflux disease) 11/09/2016    Past Medical History:  Diagnosis Date  . Carpal tunnel syndrome on left   . Chronic back pain   . Pulmonary nodules    bilateral tiny nodules per last Chest CT 08-10-2016--  monitored by pcp  . Umbilical hernia without obstruction and without gangrene     Past Surgical History:  Procedure Laterality Date  . COLONOSCOPY  2012 approx.  . INGUINAL HERNIA REPAIR Left age 61  approx.    Social History   Social History  . Marital status: Married    Spouse name: N/A  . Number of children: N/A  . Years of education: N/A   Occupational History  . Not on file.   Social History Main Topics  . Smoking status: Former Smoker    Packs/day: 1.00    Years: 30.00    Types: Cigarettes    Quit date: 12/29/2005  . Smokeless tobacco: Never Used  . Alcohol use No  . Drug use: No  . Sexual activity: Not on file   Other Topics Concern  . Not on file   Social History Narrative  . No narrative on file    History reviewed. No pertinent family history.  No current facility-administered medications for this encounter.      Allergies  Allergen Reactions  . Shellfish Allergy Shortness Of  Breath and Swelling    All shellfish and all fish with exception flounder  . Codeine Nausea And Vomiting    BP (!) 146/77   Pulse 62   Temp 97.8 F (36.6 C) (Oral)   Resp 16   Ht 6' (1.829  m)   Wt 80.5 kg (177 lb 8 oz)   SpO2 100%   BMI 24.07 kg/m   Labs: No results found for this or any previous visit (from the past 48 hour(s)).  Imaging / Studies: No results found.   Ardeth Sportsman, M.D., F.A.C.S. Gastrointestinal and Minimally Invasive Surgery [facility] [address], [address] [place], [place] [address] [phone] Main / Paging  12/31/2016 7:50 AM    DIASTASIS RECTI (M62.08) Current Plans Pt Education - [facility] Diastasis Recti: discussed with patient and provided information. PREOP - [facility] - ENCOUNTER FOR PREOPERATIVE EXAMINATION FOR GENERAL SURGICAL PROCEDURE (Z01.818) Current Plans You are being scheduled for surgery - Our schedulers will call you.  You should hear from our office's scheduling department within 5 working days about the location, date, and time of surgery. We try to make accommodations for patient's preferences in scheduling surgery, but sometimes the OR schedule or the surgeon's schedule prevents [place] from making those accommodations.  If you have not heard from our office [phone]) in 5 working days, call the office and ask for your surgeon's nurse.  If you have other questions about your diagnosis, plan, or surgery, call the office and ask for your surgeon's nurse.  Written instructions provided The anatomy & physiology of the abdominal wall was discussed. The pathophysiology of hernias was discussed. Natural history risks without surgery including progeressive enlargement, pain, incarceration, & strangulation was discussed. Contributors to complications such as smoking, obesity, diabetes, prior surgery, etc were discussed.  I feel the risks of no intervention will lead to serious problems that outweigh the  operative risks; therefore, I recommended surgery to reduce and repair the hernia. I explained laparoscopic techniques with possible need for an open approach. I noted the probable use of mesh to patch and/or buttress the hernia repair  Risks such as bleeding, infection, abscess, need for further treatment, heart attack, death, and other risks were discussed. I noted a good likelihood this will help address the problem. Goals of post-operative recovery were discussed as well. Possibility that this will not correct all symptoms was explained. I stressed the importance of low-impact activity, aggressive pain control, avoiding constipation, & not pushing through pain to minimize risk of post-operative chronic pain or injury. Possibility of reherniation especially with smoking, obesity, diabetes, immunosuppression, and other health conditions was discussed. We will work to minimize complications.  An educational handout further explaining the pathology & treatment options was given as well. Questions were answered. The patient expresses understanding & wishes to proceed with surgery.  Pt Education - [facility] Hernia Post-Op HCI (Lexy Meininger): discussed with patient and provided information. Pt Education - [facility] Pain Control (Dimas Scheck) Pt Education - Pamphlet Given - Laparoscopic Hernia Repair: discussed with patient and provided information.  Ardeth Sportsman, M.D., F.A.C.S. Gastrointestinal and Minimally Invasive Surgery [facility] [address] [place], [place] [address] [phone] Main / Paging

## 2016-12-31 NOTE — Interval H&P Note (Signed)
History and Physical Interval Note:  12/31/2016 7:50 AM  Jesus Ochoa  has presented today for surgery, with the diagnosis of UMBILICAL VENTRAL WALL HERNIA  The various methods of treatment have been discussed with the patient and family. After consideration of risks, benefits and other options for treatment, the patient has consented to  Procedure(s): LAPAROSCOPIC UMBILICAL / VENTRAL WALL HERNIA WITH MESH/ (N/A) INSERTION OF MESH (N/A) as a surgical intervention .  The patient's history has been reviewed, patient examined, no change in status, stable for surgery.  I have reviewed the patient's chart and labs.  Questions were answered to the patient's satisfaction.     Janiesha Diehl C.

## 2016-12-31 NOTE — Discharge Instructions (Addendum)
Information for Discharge Teaching: EXPAREL (bupivacaine liposome injectable suspension)   Your surgeon gave you EXPAREL(bupivacaine) in your surgical incision to help control your pain after surgery.   EXPAREL is a local anesthetic that provides pain relief by numbing the tissue around the surgical site.  EXPAREL is designed to release pain medication over time and can control pain for up to 72 hours.  Depending on how you respond to EXPAREL, you may require less pain medication during your recovery.  Possible side effects:  Temporary loss of sensation or ability to move in the area where bupivacaine was injected.  Nausea, vomiting, constipation  Rarely, numbness and tingling in your mouth or lips, lightheadedness, or anxiety may occur.  Call your doctor right away if you think you may be experiencing any of these sensations, or if you have other questions regarding possible side effects.  Follow all other discharge instructions given to you by your surgeon or nurse. Eat a healthy diet and drink plenty of water or other fluids.  If you return to the hospital for any reason within 96 hours following the administration of EXPAREL, please inform your health care providers.  HERNIA REPAIR: POST OP INSTRUCTIONS  ######################################################################  EAT Gradually transition to a high fiber diet with a fiber supplement over the next few weeks after discharge.  Start with a pureed / full liquid diet (see below)  WALK Walk an hour a day.  Control your pain to do that.    CONTROL PAIN Control pain so that you can walk, sleep, tolerate sneezing/coughing, go up/down stairs.  HAVE A BOWEL MOVEMENT DAILY Keep your bowels regular to avoid problems.  OK to try a laxative to override constipation.  OK to use an antidairrheal to slow down diarrhea.  Call if not better after 2 tries  CALL IF YOU HAVE PROBLEMS/CONCERNS Call if you are still struggling despite  following these instructions. Call if you have concerns not answered by these instructions  ######################################################################    1. DIET: Follow a light bland diet the first 24 hours after arrival home, such as soup, liquids, crackers, etc.  Be sure to include lots of fluids daily.  Avoid fast food or heavy meals as your are more likely to get nauseated.  Eat a low fat the next few days after surgery. 2. Take your usually prescribed home medications unless otherwise directed. 3. PAIN CONTROL: a. Pain is best controlled by a usual combination of three different methods TOGETHER: i. Ice/Heat ii. Over the counter pain medication iii. Prescription pain medication b. Most patients will experience some swelling and bruising around the hernia(s) such as the bellybutton, groins, or old incisions.  Ice packs or heating pads (30-60 minutes up to 6 times a day) will help. Use ice for the first few days to help decrease swelling and bruising, then switch to heat to help relax tight/sore spots and speed recovery.  Some people prefer to use ice alone, heat alone, alternating between ice & heat.  Experiment to what works for you.  Swelling and bruising can take several weeks to resolve.   c. It is helpful to take an over-the-counter pain medication regularly for the first few weeks.  Choose one of the following that works best for you: i. Naproxen (Aleve, etc)  Two 220mg  tabs twice a day ii. Ibuprofen (Advil, etc) Three 200mg  tabs four times a day (every meal & bedtime) iii. Acetaminophen (Tylenol, etc) 325-650mg  four times a day (every meal & bedtime) d. A  prescription  for pain medication should be given to you upon discharge.  Take your pain medication as prescribed.  i. If you are having problems/concerns with the prescription medicine (does not control pain, nausea, vomiting, rash, itching, etc), please call us [phone] to see if we need to switch you to a  different pain medicine that will work better for you and/or control your side effect better. ii. If you need a refill on your pain medication, please contact your pharmacy.  They will contact our office to request authorization. Prescriptions will not be filled after 5 pm or on week-ends. 4. Avoid getting constipated.  Between the surgery and the pain medications, it is common to experience some constipation.  Increasing fluid intake and taking a fiber supplement (such as Metamucil, Citrucel, FiberCon, MiraLax, etc) 1-2 times a day regularly will usually help prevent this problem from occurring.  A mild laxative (prune juice, Milk of Magnesia, MiraLax, etc) should be taken according to package directions if there are no bowel movements after 48 hours.   5. Wash / shower every day.  You may shower over the dressings as they are waterproof.   6. Remove your waterproof bandages 5 days after surgery.  You may leave the incision open to air.  You may replace a dressing/Band-Aid to cover the incision for comfort if you wish.  Continue to shower over incision(s) after the dressing is off.    7. ACTIVITIES as tolerated:   a. You may resume regular (light) daily activities beginning the next day--such as daily self-care, walking, climbing stairs--gradually increasing activities as tolerated.  If you can walk 30 minutes without difficulty, it is safe to try more intense activity such as jogging, treadmill, bicycling, low-impact aerobics, swimming, etc. b. Save the most intensive and strenuous activity for last such as sit-ups, heavy lifting, contact sports, etc  Refrain from any heavy lifting or straining until you are off narcotics for pain control.   c. DO NOT PUSH THROUGH PAIN.  Let pain be your guide: If it hurts to do something, don't do it.  Pain is your body warning you to avoid that activity for another week until the pain goes down. d. You may drive when you are no longer taking prescription pain  medication, you can comfortably wear a seatbelt, and you can safely maneuver your car and apply brakes. e. Bonita Quin may have sexual intercourse when it is comfortable.  8. FOLLOW UP in our office a. Please call [facility] at [phone] to set up an appointment to see your surgeon in the office for a follow-up appointment approximately 2-3 weeks after your surgery. b. Make sure that you call for this appointment the day you arrive home to insure a convenient appointment time. 9.  IF YOU HAVE DISABILITY OR FAMILY LEAVE FORMS, BRING THEM TO THE OFFICE FOR PROCESSING.  DO NOT GIVE THEM TO YOUR DOCTOR.  WHEN TO CALL us [phone]: 1. Poor pain control 2. Reactions / problems with new medications (rash/itching, nausea, etc)  3. Fever over 101.5 F (38.5 C) 4. Inability to urinate 5. Nausea and/or vomiting 6. Worsening swelling or bruising 7. Continued bleeding from incision. 8. Increased pain, redness, or drainage from the incision   The clinic staff is available to answer your questions during regular business hours (8:30am-5pm).  Please dont hesitate to call and ask to speak to one of our nurses for clinical concerns.   If you have a medical emergency, go to the nearest emergency room  or call 911.  A surgeon from [facility] is always on call at the hospitals in [facility]  [facility] [place] [address], [address], [place], [place]  [address] ?  [address] [address], [place]   [id number] MAIN: [id number]) [phone] ? TOLL FREE: [phone] ? FAX: [phone] [email or web address]  Post Anesthesia Home Care Instructions  Activity: Get plenty of rest for the remainder of the day. A responsible adult should stay with you for 24 hours following the procedure.  For the next 24 hours, DO NOT: -Drive a car -Advertising copywriterperate machinery -Drink alcoholic beverages -Take any medication unless instructed by your physician -Make any legal decisions or sign important  papers.  Meals: Start with liquid foods such as gelatin or soup. Progress to regular foods as tolerated. Avoid greasy, spicy, heavy foods. If nausea and/or vomiting occur, drink only clear liquids until the nausea and/or vomiting subsides. Call your physician if vomiting continues.  Special Instructions/Symptoms: Your throat may feel dry or sore from the anesthesia or the breathing tube placed in your throat during surgery. If this causes discomfort, gargle with warm salt water. The discomfort should disappear within 24 hours.  If you had a scopolamine patch placed behind your ear for the management of post- operative nausea and/or vomiting:  1. The medication in the patch is effective for 72 hours, after which it should be removed.  Wrap patch in a tissue and discard in the trash. Wash hands thoroughly with soap and water. 2. You may remove the patch earlier than 72 hours if you experience unpleasant side effects which may include dry mouth, dizziness or visual disturbances. 3. Avoid touching the patch. Wash your hands with soap and water after contact with the patch.

## 2016-12-31 NOTE — Transfer of Care (Signed)
  Last Vitals:  Vitals:   12/31/16 0749 12/31/16 1144  BP: (!) 146/77 (P) 133/84  Pulse: 62   Resp: 16 (P) 16  Temp: 36.6 C (P) 36.7 C    Last Pain:  Vitals:   12/31/16 0749  TempSrc: Oral         Immediate Anesthesia Transfer of Care Note  Patient: Jesus Ochoa  Procedure(s) Performed: Procedure(s) (LRB): LAPAROSCOPIC UMBILICAL / VENTRAL WALL HERNIA WITH MESH/ (N/A) INSERTION OF MESH (N/A)  Patient Location: PACU  Anesthesia Type: General  Level of Consciousness: awake, alert  and oriented  Airway & Oxygen Therapy: Patient Spontanous Breathing and Patient connected to face mask oxygen  Post-op Assessment: Report given to PACU RN and Post -op Vital signs reviewed and stable  Post vital signs: Reviewed and stable  Complications: No apparent anesthesia complications

## 2016-12-31 NOTE — Anesthesia Postprocedure Evaluation (Addendum)
Anesthesia Post Note  Patient: Jesus Ochoa  Procedure(s) Performed: Procedure(s) (LRB): LAPAROSCOPIC UMBILICAL / VENTRAL WALL HERNIA WITH MESH/ (N/A) INSERTION OF MESH (N/A)  Patient location during evaluation: PACU Anesthesia Type: General Level of consciousness: awake and alert Pain management: pain level controlled Vital Signs Assessment: post-procedure vital signs reviewed and stable Respiratory status: spontaneous breathing, nonlabored ventilation, respiratory function stable and patient connected to nasal cannula oxygen Cardiovascular status: blood pressure returned to baseline and stable Postop Assessment: no signs of nausea or vomiting Anesthetic complications: no       Last Vitals:  Vitals:   12/31/16 1200 12/31/16 1215  BP: 134/75 126/73  Pulse: 67 65  Resp: 13 14  Temp:      Last Pain:  Vitals:   12/31/16 0749  TempSrc: Oral                 Dorice Stiggers S

## 2016-12-31 NOTE — Anesthesia Preprocedure Evaluation (Addendum)
Anesthesia Evaluation  Patient identified by MRN, date of birth, ID band Patient awake    Reviewed: Allergy & Precautions, NPO status , Patient's Chart, lab work & pertinent test results  Airway Mallampati: I  TM Distance: >3 FB Neck ROM: Full    Dental no notable dental hx. (+) Teeth Intact   Pulmonary former smoker,    Pulmonary exam normal breath sounds clear to auscultation       Cardiovascular negative cardio ROS Normal cardiovascular exam Rhythm:Regular Rate:Normal     Neuro/Psych Lumbar radiculopathy Left CTS  Neuromuscular disease    GI/Hepatic Neg liver ROS, GERD  Medicated and Controlled,  Endo/Other  negative endocrine ROS  Renal/GU negative Renal ROS  negative genitourinary   Musculoskeletal Diastasis recti Umbilical hernia without obstruction   Abdominal (+) - obese,   Peds  Hematology   Anesthesia Other Findings   Reproductive/Obstetrics                              Chemistry   No results found for: NA, K, CL, CO2, BUN, CREATININE, GLU No results found for: CALCIUM, ALKPHOS, AST, ALT, BILITOT   No results found for: WBC, HGB, HCT, MCV, PLT  Anesthesia Physical Anesthesia Plan  ASA: II  Anesthesia Plan: General   Post-op Pain Management:    Induction: Intravenous  Airway Management Planned: Oral ETT  Additional Equipment:   Intra-op Plan:   Post-operative Plan: Extubation in OR  Informed Consent: I have reviewed the patients History and Physical, chart, labs and discussed the procedure including the risks, benefits and alternatives for the proposed anesthesia with the patient or authorized representative who has indicated his/her understanding and acceptance.     Plan Discussed with: CRNA, Anesthesiologist and Surgeon  Anesthesia Plan Comments:         Anesthesia Quick Evaluation

## 2016-12-31 NOTE — Anesthesia Procedure Notes (Signed)
Procedure Name: Intubation Date/Time: 12/31/2016 9:54 AM Performed by: Myrtie Soman Pre-anesthesia Checklist: Patient identified, Emergency Drugs available, Suction available and Patient being monitored Patient Re-evaluated:Patient Re-evaluated prior to inductionOxygen Delivery Method: Circle system utilized Preoxygenation: Pre-oxygenation with 100% oxygen Intubation Type: IV induction Ventilation: Mask ventilation without difficulty Laryngoscope Size: Mac and 4 Grade View: Grade I Tube type: Oral Tube size: 8.0 mm Number of attempts: 1 Airway Equipment and Method: Stylet and LTA kit utilized Placement Confirmation: ETT inserted through vocal cords under direct vision,  positive ETCO2 and breath sounds checked- equal and bilateral Secured at: 22 cm Tube secured with: Tape Dental Injury: Teeth and Oropharynx as per pre-operative assessment

## 2016-12-31 NOTE — Op Note (Signed)
12/31/2016  PATIENT:  Jesus Ochoa  61 y.o. male  Patient Care Team: Catha Gosselin, MD as PCP - General (Family Medicine) Karie Soda, MD as Consulting Physician (General Surgery) Aletha Halim, MD as Consulting Physician (Neurology)  PRE-OPERATIVE DIAGNOSIS:  UMBILICAL VENTRAL WALL HERNIA  POST-OPERATIVE DIAGNOSIS:  UMBILICAL VENTRAL WALL HERNIA  PROCEDURE:  Procedure(s): LAPAROSCOPIC UMBILICAL / VENTRAL WALL HERNIA WITH MESH/ INSERTION OF MESH  LAPAROSCOPIC REPAIR OF  Periumbilical New abdominal wall hernia(s)  HERNIA WITH MESH  SURGEON:  Ardeth Sportsman, MD  ASSISTANT: Flint Melter, PA-S, [employer]   ANESTHESIA:     General  Local anesthesia field block: (0.25% bupivacaine &  liposomal  Bupivacaine [Experel])  EBL:  Total I/O In: 600 [I.V.:600] Out: 10 [Blood:10]  Per anesthesia record  Delay start of Pharmacological VTE agent (>24hrs) due to surgical blood loss or risk of bleeding:  no  DRAINS: none   SPECIMEN:  No Specimen  DISPOSITION OF SPECIMEN:  N/A  COUNTS:  YES  PLAN OF CARE: Discharge to home after PACU  PATIENT DISPOSITION:  PACU - hemodynamically stable.  INDICATION: Pleasant patient has developed a ventral wall abdominal hernia.   Recommendation was made for surgical repair:  The anatomy & physiology of the abdominal wall was discussed. The pathophysiology of hernias was discussed. Natural history risks without surgery including progeressive enlargement, pain, incarceration & strangulation was discussed. Contributors to complications such as smoking, obesity, diabetes, prior surgery, etc were discussed.  I feel the risks of no intervention will lead to serious problems that outweigh the operative risks; therefore, I recommended surgery to reduce and repair the hernia. I explained laparoscopic techniques with possible need for an open approach. I noted the probable use of mesh to patch and/or buttress the hernia repair  Risks such as  bleeding, infection, abscess, need for further treatment, heart attack, death, and other risks were discussed. I noted a good likelihood this will help address the problem. Goals of post-operative recovery were discussed as well. Possibility that this will not correct all symptoms was explained. I stressed the importance of low-impact activity, aggressive pain control, avoiding constipation, & not pushing through pain to minimize risk of post-operative chronic pain or injury. Possibility of reherniation especially with smoking, obesity, diabetes, immunosuppression, and other health conditions was discussed. We will work to minimize complications.  An educational handout further explaining the pathology & treatment options was given as well. Questions were answered. The patient expresses understanding & wishes to proceed with surgery.   OR FINDINGS: 4x3cm periumbilical VWH with moderate diastasis recti  Type of repair: Laparoscopic underlay repair   Placement of mesh: Intraperitoneal underlay repair  Name of mesh: Bard Ventralight dual sided (polypropylene / Seprafilm)  Size of mesh: 20x15cm  Orientation: Vertical  Mesh overlap:  5-7cm   DESCRIPTION:   Informed consent was confirmed. The patient underwent general anaesthesia without difficulty. The patient was positioned appropriately. VTE prevention in place. The patient's abdomen was clipped, prepped, & draped in a sterile fashion. Surgical timeout confirmed our plan.  The patient was positioned in reverse Trendelenburg. Abdominal entry was gained using optical entry technique in the left upper abdomen. Entry was clean. I induced carbon dioxide insufflation. Camera inspection revealed no injury. Extra ports were carefully placed under direct laparoscopic visualization.   I could see the hernia on the prietal peritoneum under the abdominal wall.  I laparoscopically freed the falciform ligament and peritoneum off the central abdomen to reduce  the possible ligament and preperitoneal fat  out of the periumbilical ventral hernia.  I noted no other hernias elsewhere.    20x15cm dual sided mesh.  I placed #1 Prolene stitches around its edge about every 5 cm = 10 total.  I rolled the mesh & placed into the peritoneal cavity through the hernia defect.  I unrolled the mesh and positioned it appropriately.  I secured the mesh to cover up the hernia defect using a laparoscopic suture passer to pass the tails of the Prolene through the abdominal wall & tagged them with clamps for good transfascial suturing.  I started out in four corners to make sure I had the mesh centered under the hernia defect appropriately, and then proceeded to work in quadrants.    We evacuated CO2 & desufflated the abdomen.  I tied the fascial stitches down. I closed the fascial defect that I placed the mesh through using #1 PDS interrupted transverse stitches primarily.  I reinsufflated the abdomen. The mesh provided at least circumferential coverage around the entire region of hernia defects.  I secured the mesh centrally with an additional trans fascial stitch in & out the mesh using #1 PDS under laparoscopic visualization.   I tacked the edges & central part of the mesh to the peritoneum/posterior rectus fascia with SecureStrap absorbable tacks.   I did reinspection. Hemostasis was good. Mesh laid well. I completed a broad field block of local anesthesia at fascial stitch sites & fascial closure areas.    Capnoperitoneum was evacuated. Ports were removed. The skin was closed with Monocryl at the port sites and Steri-Strips on the fascial stitch puncture sites.  Patient is being extubated to go to the recovery room.  I discussed operative findings, updated the patient's status, discussed probable steps to recovery, and gave postoperative recommendations to the patient's spouse.  Recommendations were made.  Questions were answered.  She expressed understanding &  appreciation.  Ardeth SportsmanSteven C. Mya Suell, M.D., F.A.C.S. Gastrointestinal and Minimally Invasive Surgery [facility] [address] [place], [place] [address] [phone] Main / Paging  12/31/2016 11:31 AM

## 2017-01-01 ENCOUNTER — Encounter (HOSPITAL_BASED_OUTPATIENT_CLINIC_OR_DEPARTMENT_OTHER): Payer: Self-pay | Admitting: Surgery

## 2017-05-17 NOTE — Addendum Note (Signed)
Addendum  created 05/17/17 1012 by Najmo Pardue, MD   Sign clinical note    

## 2017-08-12 ENCOUNTER — Ambulatory Visit (INDEPENDENT_AMBULATORY_CARE_PROVIDER_SITE_OTHER)
Admission: RE | Admit: 2017-08-12 | Discharge: 2017-08-12 | Disposition: A | Discharge status: Home / Self Care | Payer: Managed Care, Other (non HMO) | Source: Ambulatory Visit | Attending: Acute Care | Admitting: Acute Care

## 2017-08-12 DIAGNOSIS — Z87891 Personal history of nicotine dependence: Secondary | ICD-10-CM | POA: Diagnosis not present

## 2017-08-17 ENCOUNTER — Other Ambulatory Visit: Payer: Self-pay | Admitting: Acute Care

## 2017-08-17 DIAGNOSIS — Z122 Encounter for screening for malignant neoplasm of respiratory organs: Secondary | ICD-10-CM

## 2017-08-17 DIAGNOSIS — F17210 Nicotine dependence, cigarettes, uncomplicated: Principal | ICD-10-CM

## 2018-08-30 ENCOUNTER — Ambulatory Visit (INDEPENDENT_AMBULATORY_CARE_PROVIDER_SITE_OTHER)
Admission: RE | Admit: 2018-08-30 | Discharge: 2018-08-30 | Disposition: A | Discharge status: Home / Self Care | Payer: Managed Care, Other (non HMO) | Source: Ambulatory Visit | Attending: Acute Care | Admitting: Acute Care

## 2018-08-30 DIAGNOSIS — Z122 Encounter for screening for malignant neoplasm of respiratory organs: Secondary | ICD-10-CM

## 2018-08-30 DIAGNOSIS — F17210 Nicotine dependence, cigarettes, uncomplicated: Secondary | ICD-10-CM

## 2018-09-01 ENCOUNTER — Other Ambulatory Visit: Payer: Self-pay | Admitting: Acute Care

## 2018-09-01 DIAGNOSIS — Z122 Encounter for screening for malignant neoplasm of respiratory organs: Secondary | ICD-10-CM

## 2018-09-01 DIAGNOSIS — Z87891 Personal history of nicotine dependence: Secondary | ICD-10-CM

## 2019-09-19 ENCOUNTER — Other Ambulatory Visit: Payer: Self-pay

## 2019-09-19 ENCOUNTER — Ambulatory Visit (INDEPENDENT_AMBULATORY_CARE_PROVIDER_SITE_OTHER)
Admission: RE | Admit: 2019-09-19 | Discharge: 2019-09-19 | Disposition: A | Discharge status: Home / Self Care | Payer: BC Managed Care – PPO | Source: Ambulatory Visit | Attending: Acute Care | Admitting: Acute Care

## 2019-09-19 DIAGNOSIS — Z87891 Personal history of nicotine dependence: Secondary | ICD-10-CM | POA: Diagnosis not present

## 2019-09-19 DIAGNOSIS — Z122 Encounter for screening for malignant neoplasm of respiratory organs: Secondary | ICD-10-CM

## 2019-09-21 ENCOUNTER — Other Ambulatory Visit: Payer: Self-pay | Admitting: *Deleted

## 2019-09-21 DIAGNOSIS — Z87891 Personal history of nicotine dependence: Secondary | ICD-10-CM

## 2019-09-21 DIAGNOSIS — Z122 Encounter for screening for malignant neoplasm of respiratory organs: Secondary | ICD-10-CM

## 2020-03-07 ENCOUNTER — Ambulatory Visit: Payer: BC Managed Care – PPO | Attending: Internal Medicine

## 2020-03-07 DIAGNOSIS — Z23 Encounter for immunization: Secondary | ICD-10-CM

## 2020-03-07 NOTE — Progress Notes (Signed)
   Covid-19 Vaccination Clinic  Name:  Jesus Ochoa    MRN: [id number] DOB: Sep 06, 1956  03/07/2020  Jesus Ochoa was observed post Covid-19 immunization for 30 minutes based on pre-vaccination screening without incident. He was provided with Vaccine Information Sheet and instruction to access the V-Safe system.   Jesus Ochoa was instructed to call 911 with any severe reactions post vaccine: Marland Kitchen Difficulty breathing  . Swelling of face and throat  . A fast heartbeat  . A bad rash all over body  . Dizziness and weakness   Immunizations Administered    Name Date Dose VIS Date Route   Moderna COVID-19 Vaccine 03/07/2020 11:18 AM 0.5 mL 11/14/2019 Intramuscular   Manufacturer: Moderna   Lot: [id number]   NDC: 72072-182-88

## 2020-04-09 ENCOUNTER — Ambulatory Visit: Payer: Self-pay | Attending: Internal Medicine

## 2020-04-09 DIAGNOSIS — Z23 Encounter for immunization: Secondary | ICD-10-CM

## 2020-04-09 NOTE — Progress Notes (Signed)
   Covid-19 Vaccination Clinic  Name:  Cortavious Nix    MRN: [id number] DOB: June 11, 1956  04/09/2020  Mr. Lemay was observed post Covid-19 immunization for 15 minutes without incident. He was provided with Vaccine Information Sheet and instruction to access the V-Safe system.   Mr. Budreau was instructed to call 911 with any severe reactions post vaccine: Marland Kitchen Difficulty breathing  . Swelling of face and throat  . A fast heartbeat  . A bad rash all over body  . Dizziness and weakness   Immunizations Administered    Name Date Dose VIS Date Route   Moderna COVID-19 Vaccine 04/09/2020 10:34 AM 0.5 mL 11/2019 Intramuscular   Manufacturer: Moderna   Lot: [id number]   NDC: 89791-504-13

## 2020-10-02 ENCOUNTER — Ambulatory Visit (INDEPENDENT_AMBULATORY_CARE_PROVIDER_SITE_OTHER)
Admission: RE | Admit: 2020-10-02 | Discharge: 2020-10-02 | Disposition: A | Discharge status: Home / Self Care | Payer: 59 | Source: Ambulatory Visit | Attending: Acute Care | Admitting: Acute Care

## 2020-10-02 ENCOUNTER — Other Ambulatory Visit: Payer: Self-pay

## 2020-10-02 DIAGNOSIS — Z87891 Personal history of nicotine dependence: Secondary | ICD-10-CM

## 2020-10-02 DIAGNOSIS — Z122 Encounter for screening for malignant neoplasm of respiratory organs: Secondary | ICD-10-CM

## 2020-10-10 NOTE — Progress Notes (Signed)
Please call patient and let them  know their  low dose Ct was read as a Lung RADS 2: nodules that are benign in appearance and behavior with a very low likelihood of becoming a clinically active cancer due to size or lack of growth. Recommendation per radiology is for a repeat LDCT in 12 months. .Please let them  know we will order and schedule their  annual screening scan for 09/2021.  Pt.  is not  currently on statin therapy. Please place order for annual  screening scan for  09/2021 and fax results to PCP. Thanks so much.

## 2020-10-11 ENCOUNTER — Other Ambulatory Visit: Payer: Self-pay | Admitting: *Deleted

## 2021-04-12 IMAGING — CT CT CHEST LUNG CANCER SCREENING LOW DOSE W/O CM
1 of 3 series · 10 of 30 positions shown, 13 images · non-contrast
Comparison: 08/30/2018

CLINICAL DATA: 62-year-old male with 30 pack-year history of
smoking. Lung cancer screening.

EXAM:
CT CHEST WITHOUT CONTRAST LOW-DOSE FOR LUNG CANCER SCREENING
TECHNIQUE: Multidetector CT imaging of the chest was performed following the
standard protocol without IV contrast.

[ct lung segmentation data · axial · 0.75mm/px · z∈[-359,-359]mm · 10 of 327 frames shown]
[frame 1/327  mediastinal]
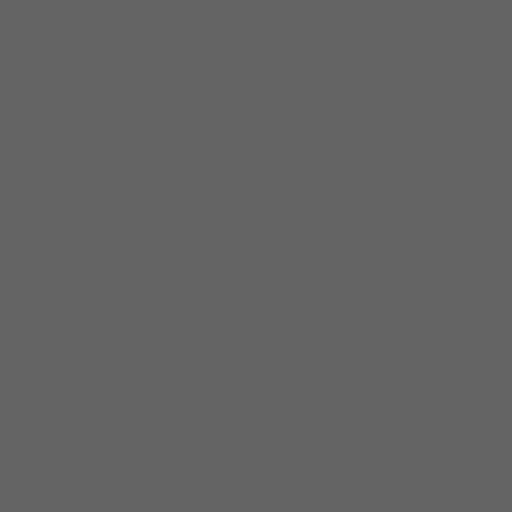
[frame 1/327  lung]
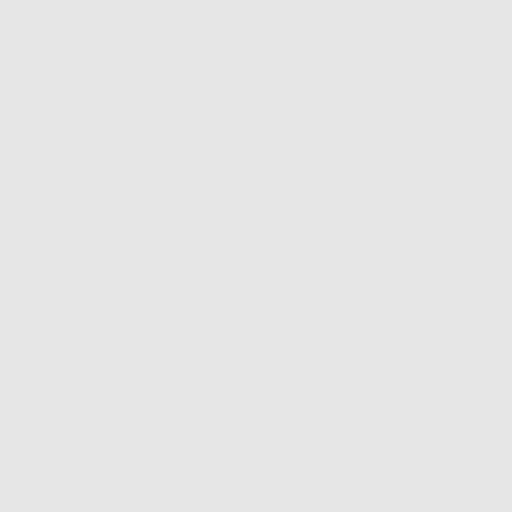
[frame 37/327  lung]
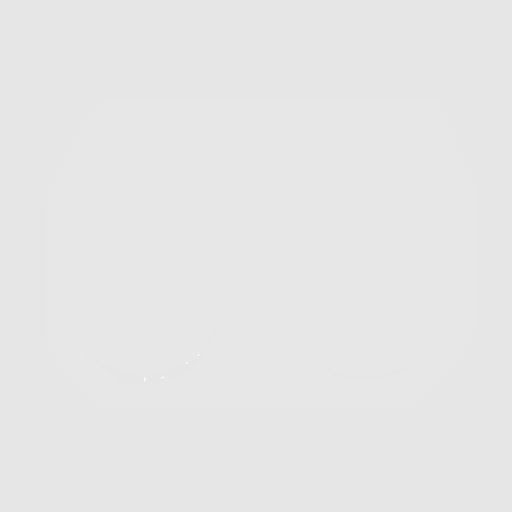
[frame 73/327  lung]
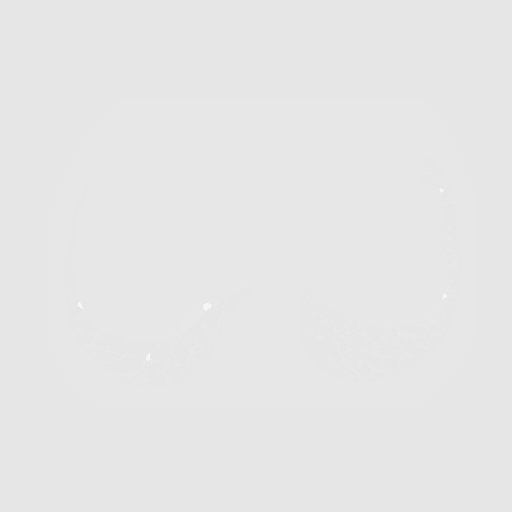
[frame 109/327  lung]
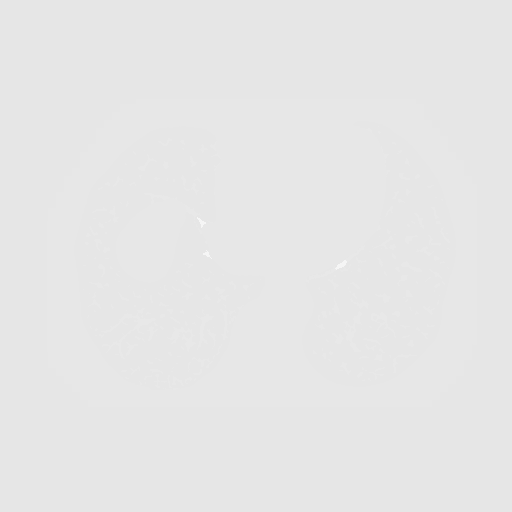
[frame 145/327  mediastinal]
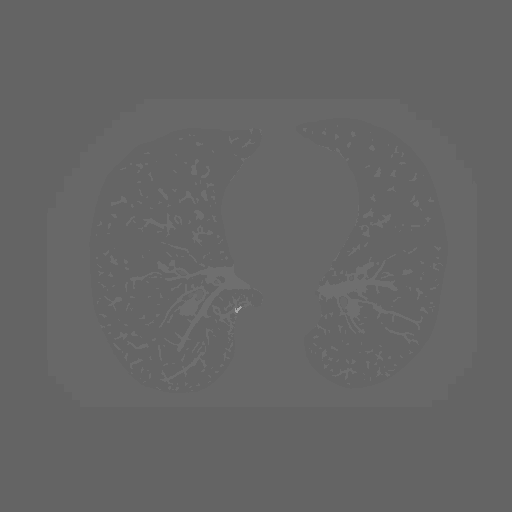
[frame 145/327  lung]
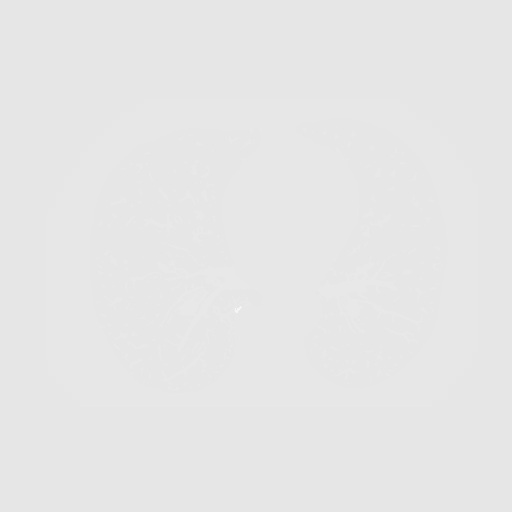
[frame 182/327  lung]
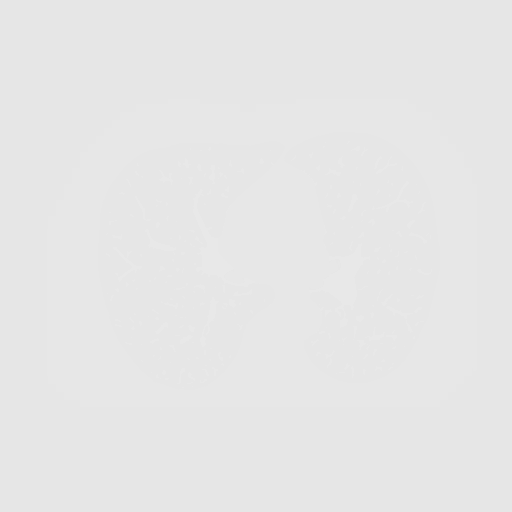
[frame 218/327  lung]
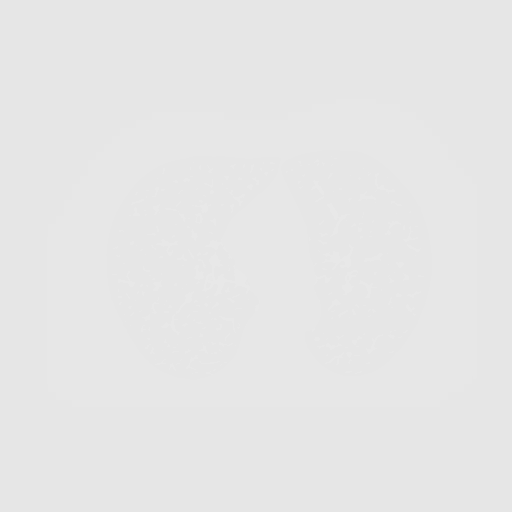
[frame 254/327  lung]
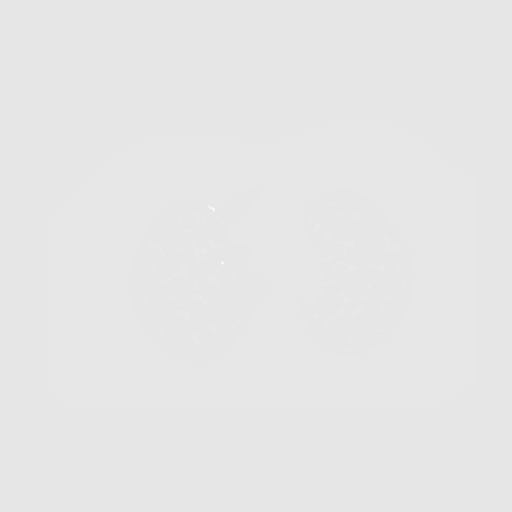
[frame 290/327  mediastinal]
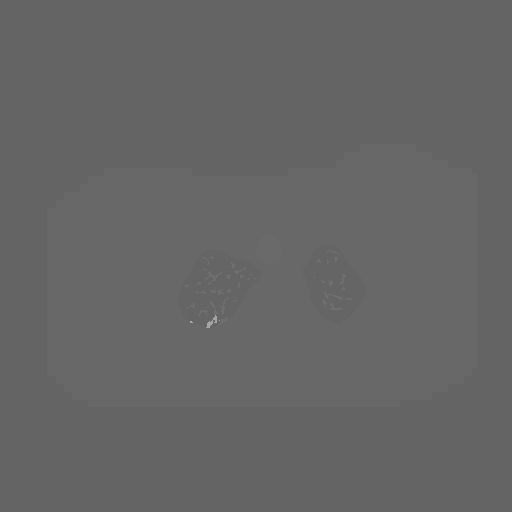
[frame 290/327  lung]
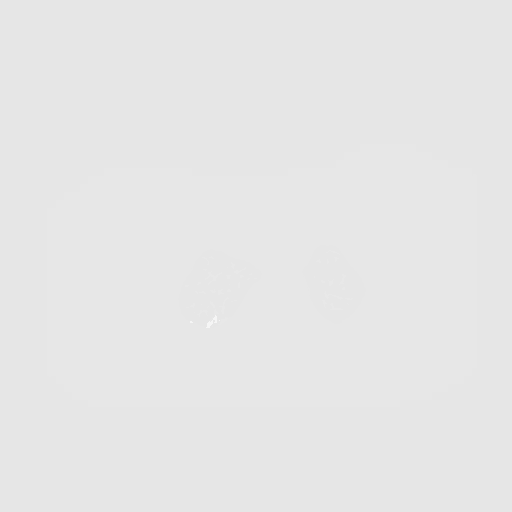
[frame 327/327  lung]
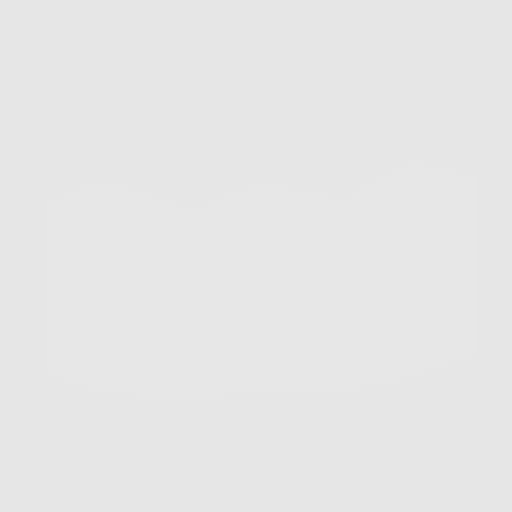

[10 of 30 positions shown; findings below may reference images not displayed]

FINDINGS: Cardiovascular: The heart size is normal. No substantial pericardial
effusion. Atherosclerotic calcification is noted in the wall of the
thoracic aorta.

Mediastinum/Nodes: No mediastinal lymphadenopathy. No evidence for
gross hilar lymphadenopathy although assessment is limited by the
lack of intravenous contrast on today's study. The esophagus has
normal imaging features. There is no axillary lymphadenopathy.

Lungs/Pleura: Centrilobular and paraseptal emphysema evident.
Previously identified tiny bilateral pulmonary nodules are stable.
No new suspicious pulmonary nodule or mass. No focal airspace
consolidation. No pleural effusion.

Upper Abdomen: Unremarkable

Musculoskeletal: No worrisome lytic or sclerotic osseous
abnormality.
IMPRESSION: 1. Lung-RADS 2, benign appearance or behavior. Continue annual
screening with low-dose chest CT without contrast in 12 months.
2. Emphysema.

Emphysema ([id number]).

## 2021-12-23 DIAGNOSIS — M79672 Pain in left foot: Secondary | ICD-10-CM | POA: Diagnosis not present

## 2021-12-23 DIAGNOSIS — L84 Corns and callosities: Secondary | ICD-10-CM | POA: Diagnosis not present

## 2022-05-25 ENCOUNTER — Other Ambulatory Visit: Payer: Self-pay | Admitting: Family Medicine

## 2022-05-25 DIAGNOSIS — I700 Atherosclerosis of aorta: Secondary | ICD-10-CM | POA: Diagnosis not present

## 2022-05-25 DIAGNOSIS — J439 Emphysema, unspecified: Secondary | ICD-10-CM | POA: Diagnosis not present

## 2022-05-25 DIAGNOSIS — E039 Hypothyroidism, unspecified: Secondary | ICD-10-CM | POA: Diagnosis not present

## 2022-05-25 DIAGNOSIS — Z23 Encounter for immunization: Secondary | ICD-10-CM | POA: Diagnosis not present

## 2022-05-25 DIAGNOSIS — E7800 Pure hypercholesterolemia, unspecified: Secondary | ICD-10-CM | POA: Diagnosis not present

## 2022-05-25 DIAGNOSIS — Z1159 Encounter for screening for other viral diseases: Secondary | ICD-10-CM | POA: Diagnosis not present

## 2022-05-25 DIAGNOSIS — Z0000 Encounter for general adult medical examination without abnormal findings: Secondary | ICD-10-CM

## 2022-05-25 DIAGNOSIS — Z131 Encounter for screening for diabetes mellitus: Secondary | ICD-10-CM | POA: Diagnosis not present

## 2022-06-02 ENCOUNTER — Ambulatory Visit
Admission: RE | Admit: 2022-06-02 | Discharge: 2022-06-02 | Disposition: A | Discharge status: Home / Self Care | Payer: Medicare Other | Source: Ambulatory Visit | Attending: Family Medicine | Admitting: Family Medicine

## 2022-06-02 DIAGNOSIS — Z0000 Encounter for general adult medical examination without abnormal findings: Secondary | ICD-10-CM

## 2022-06-02 DIAGNOSIS — Z136 Encounter for screening for cardiovascular disorders: Secondary | ICD-10-CM | POA: Diagnosis not present

## 2022-07-30 DIAGNOSIS — B078 Other viral warts: Secondary | ICD-10-CM | POA: Diagnosis not present

## 2022-07-30 DIAGNOSIS — L821 Other seborrheic keratosis: Secondary | ICD-10-CM | POA: Diagnosis not present

## 2022-08-19 DIAGNOSIS — H2513 Age-related nuclear cataract, bilateral: Secondary | ICD-10-CM | POA: Diagnosis not present

## 2022-08-28 DIAGNOSIS — L820 Inflamed seborrheic keratosis: Secondary | ICD-10-CM | POA: Diagnosis not present

## 2022-10-13 DIAGNOSIS — Z23 Encounter for immunization: Secondary | ICD-10-CM | POA: Diagnosis not present

## 2023-06-08 DIAGNOSIS — Z131 Encounter for screening for diabetes mellitus: Secondary | ICD-10-CM | POA: Diagnosis not present

## 2023-06-08 DIAGNOSIS — E039 Hypothyroidism, unspecified: Secondary | ICD-10-CM | POA: Diagnosis not present

## 2023-06-08 DIAGNOSIS — E7800 Pure hypercholesterolemia, unspecified: Secondary | ICD-10-CM | POA: Diagnosis not present

## 2023-06-14 DIAGNOSIS — Z131 Encounter for screening for diabetes mellitus: Secondary | ICD-10-CM | POA: Diagnosis not present

## 2023-06-14 DIAGNOSIS — E7800 Pure hypercholesterolemia, unspecified: Secondary | ICD-10-CM | POA: Diagnosis not present

## 2023-06-14 DIAGNOSIS — J309 Allergic rhinitis, unspecified: Secondary | ICD-10-CM | POA: Diagnosis not present

## 2023-06-14 DIAGNOSIS — Z0000 Encounter for general adult medical examination without abnormal findings: Secondary | ICD-10-CM | POA: Diagnosis not present

## 2023-06-14 DIAGNOSIS — E039 Hypothyroidism, unspecified: Secondary | ICD-10-CM | POA: Diagnosis not present

## 2023-06-14 DIAGNOSIS — E559 Vitamin D deficiency, unspecified: Secondary | ICD-10-CM | POA: Diagnosis not present

## 2023-09-06 DIAGNOSIS — H2513 Age-related nuclear cataract, bilateral: Secondary | ICD-10-CM | POA: Diagnosis not present

## 2023-09-17 DIAGNOSIS — H1045 Other chronic allergic conjunctivitis: Secondary | ICD-10-CM | POA: Diagnosis not present

## 2023-09-17 DIAGNOSIS — J3089 Other allergic rhinitis: Secondary | ICD-10-CM | POA: Diagnosis not present

## 2023-12-25 IMAGING — US US ABDOMINAL AORTA SCREENING AAA
1 series · 14 of 24 positions shown · non-contrast
Comparison: None Available.

CLINICAL DATA: Medicare welcome exam

EXAM:
US ABDOMINAL AORTA MEDICARE SCREENING
TECHNIQUE: Ultrasound examination of the abdominal aorta was performed as a
screening evaluation for abdominal aortic aneurysm.

[Series 1: us abdominal aorta screening aaa · 0.33mm/px · 14 of 24 slices shown]
[im 1/24]
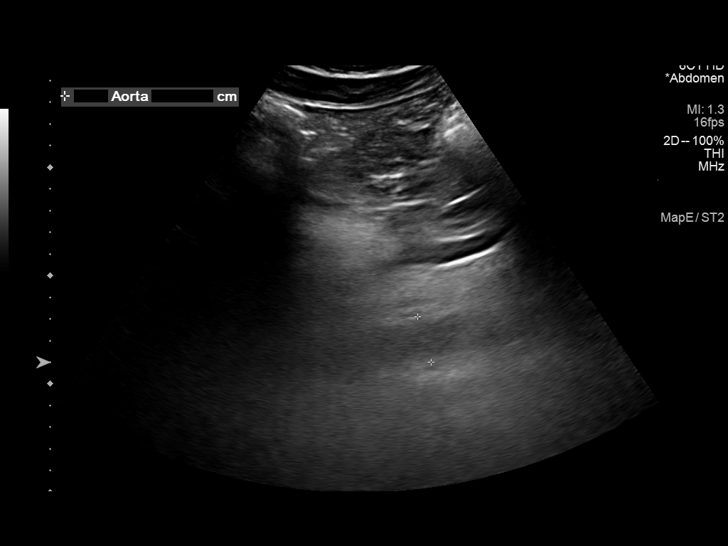
[im 3/24]
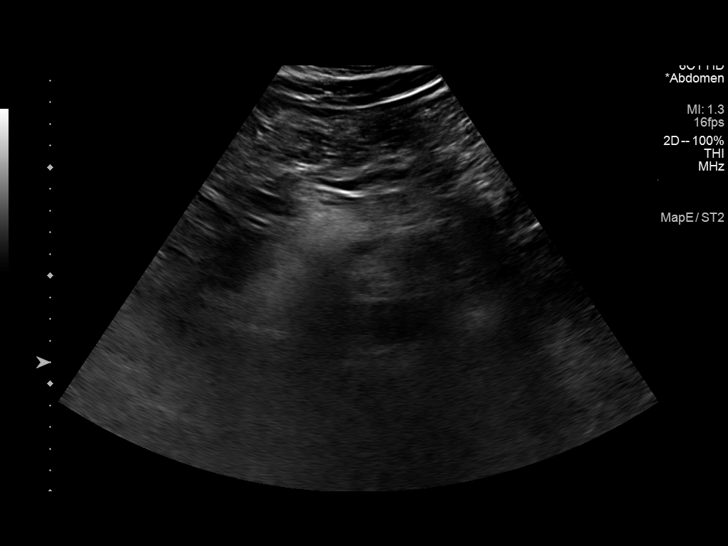
[im 5/24]
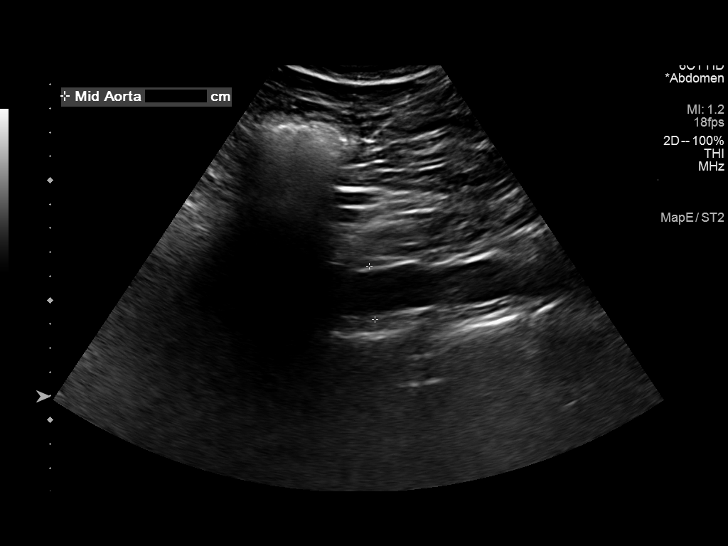
[im 7/24]
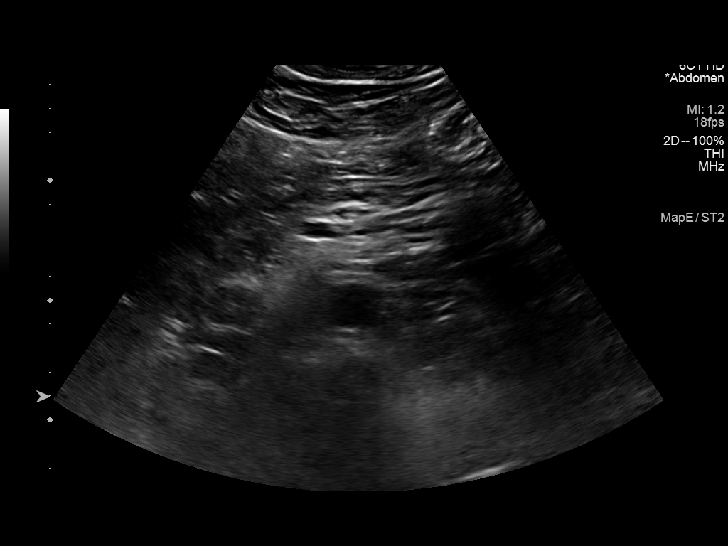
[im 8/24]
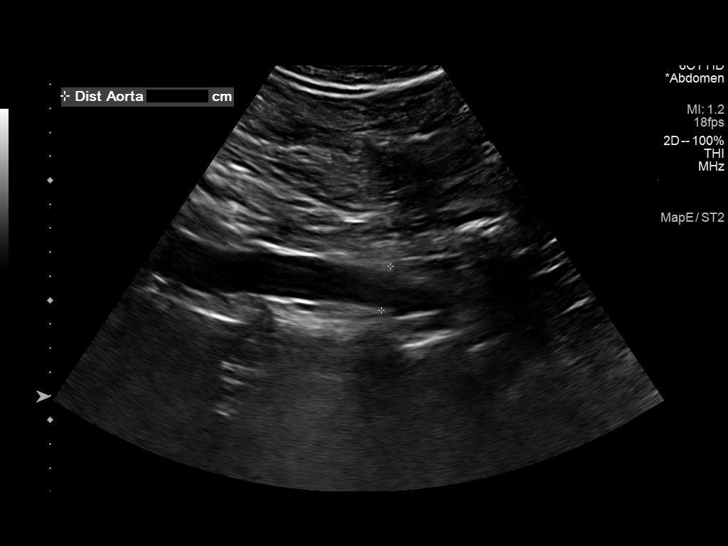
[im 10/24]
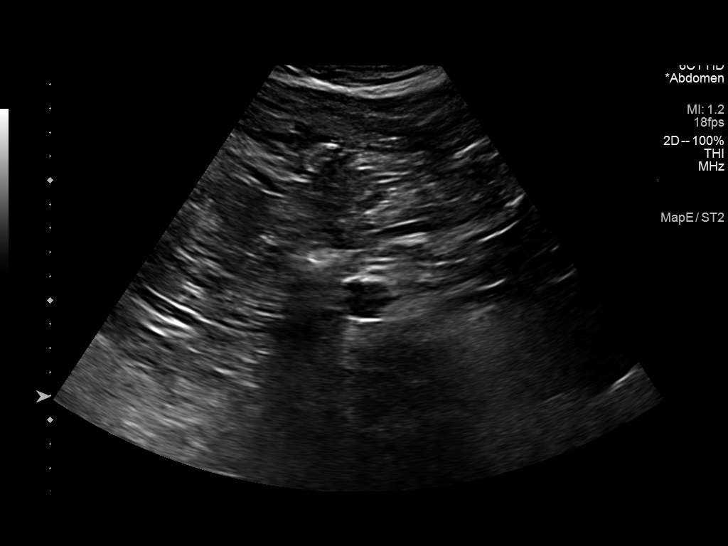
[im 12/24]
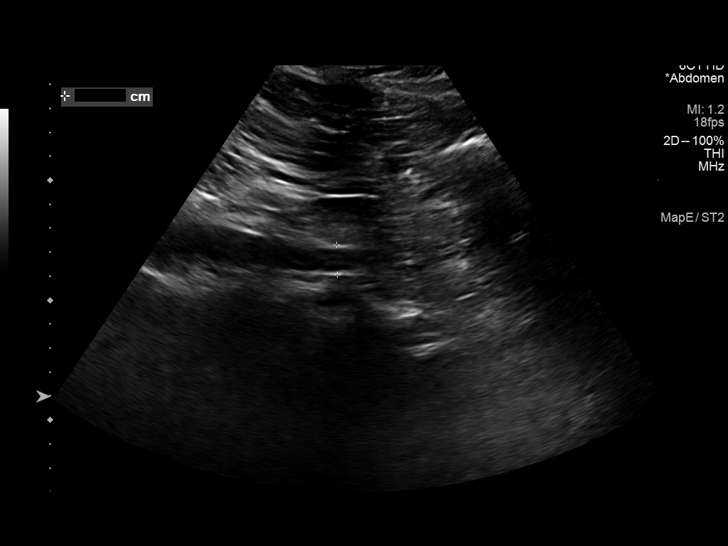
[im 13/24]
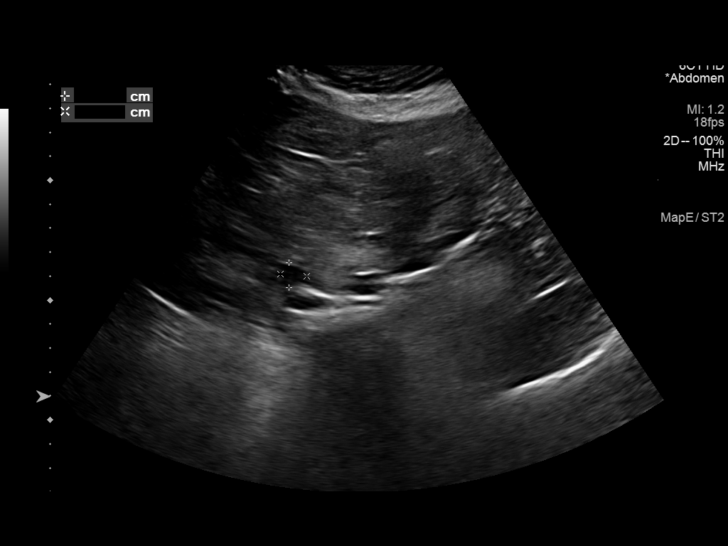
[im 15/24]
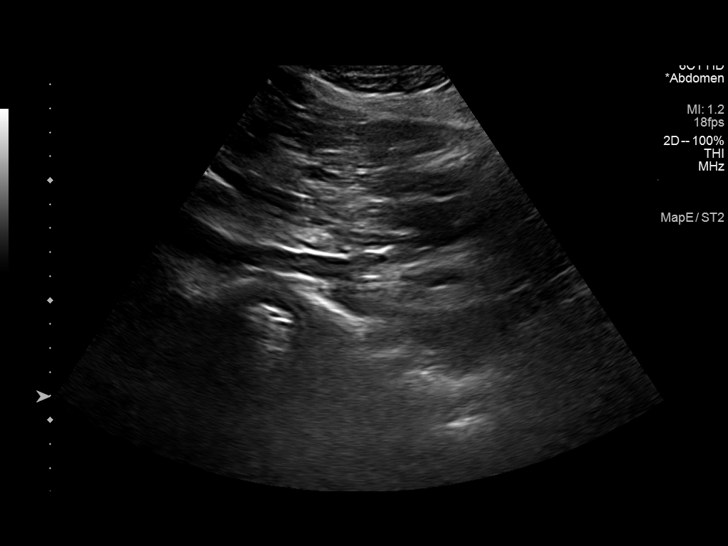
[im 17/24]
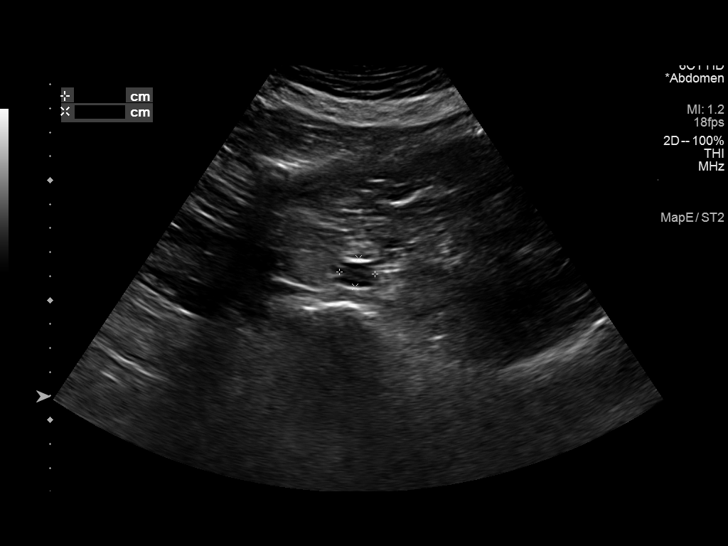
[im 19/24]
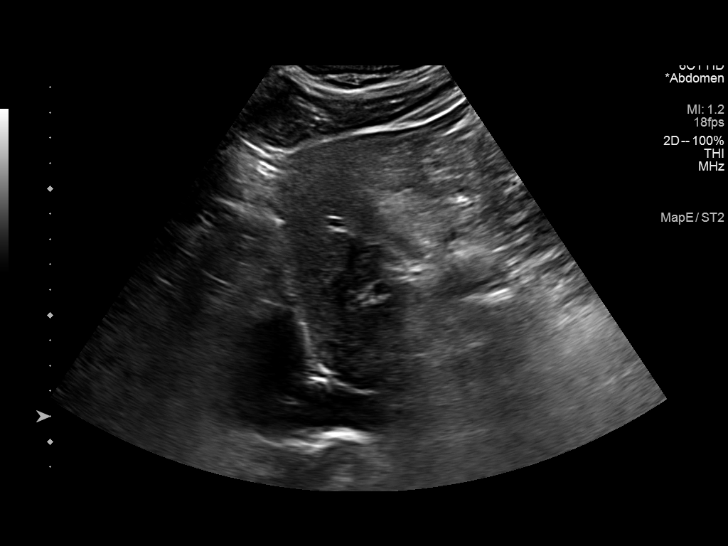
[im 20/24]
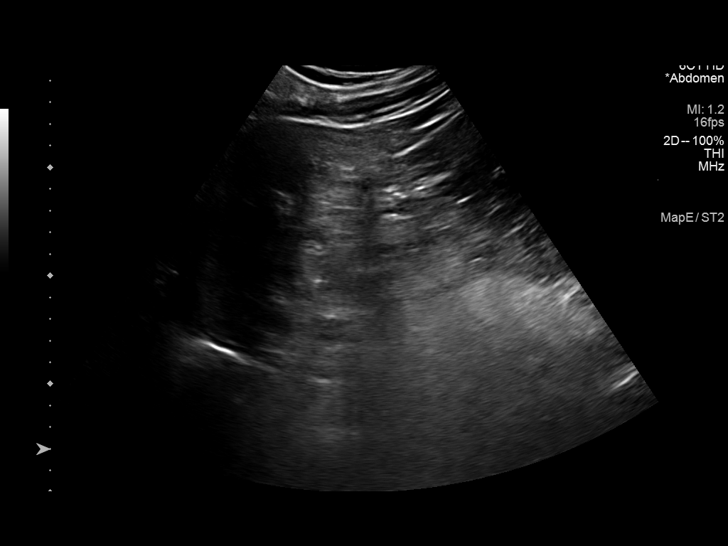
[im 22/24]
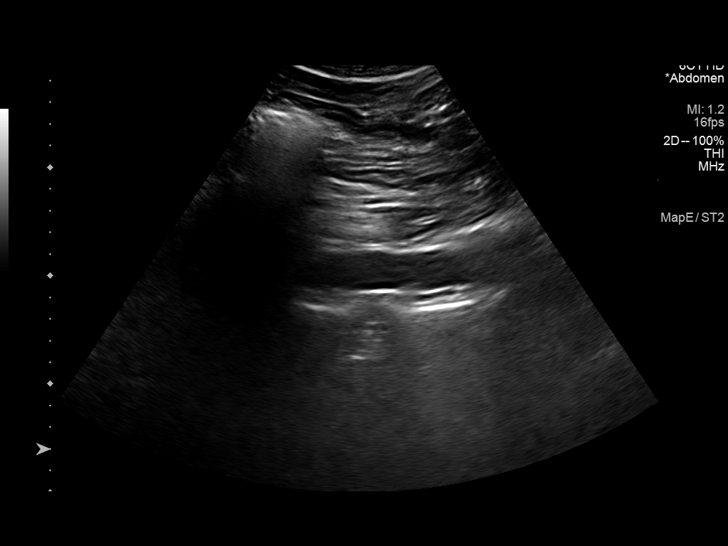
[im 24/24]
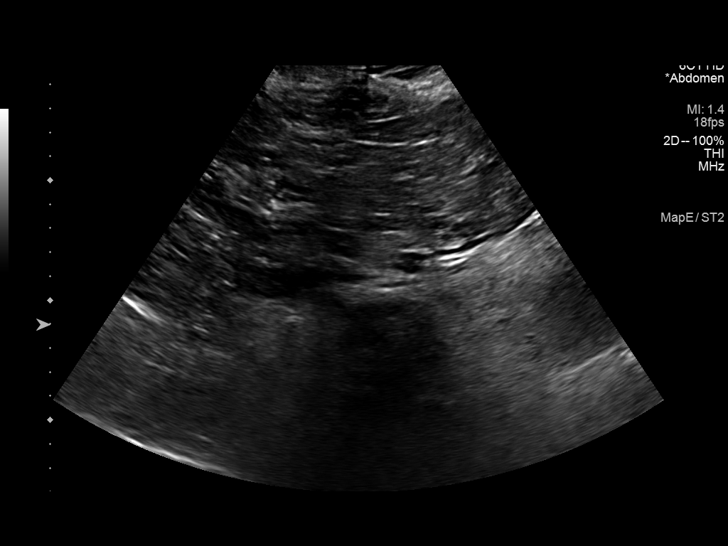

[14 of 24 positions shown; findings below may reference images not displayed]

FINDINGS: Abdominal aortic measurements as follows:

Proximal:  2.2 AP x 2.7 transverse cm

Mid:  2.2 AP x 2.3 transverse cm

Distal:  1.9 x 1.9 cm
IMPRESSION: Proximal abdominal aorta measures 2.2 cm AP x 2.27 cm transverse.

Recommend follow-up ultrasound every 5 years. This recommendation
follows ACR consensus guidelines: White Paper of the ACR [employer]

## 2024-02-18 DIAGNOSIS — J3089 Other allergic rhinitis: Secondary | ICD-10-CM | POA: Diagnosis not present

## 2024-02-18 DIAGNOSIS — H1045 Other chronic allergic conjunctivitis: Secondary | ICD-10-CM | POA: Diagnosis not present

## 2024-02-18 DIAGNOSIS — Z91013 Allergy to seafood: Secondary | ICD-10-CM | POA: Diagnosis not present

## 2024-03-06 DIAGNOSIS — R051 Acute cough: Secondary | ICD-10-CM | POA: Diagnosis not present

## 2024-06-15 DIAGNOSIS — E7800 Pure hypercholesterolemia, unspecified: Secondary | ICD-10-CM | POA: Diagnosis not present

## 2024-06-15 DIAGNOSIS — Z131 Encounter for screening for diabetes mellitus: Secondary | ICD-10-CM | POA: Diagnosis not present

## 2024-06-15 DIAGNOSIS — E039 Hypothyroidism, unspecified: Secondary | ICD-10-CM | POA: Diagnosis not present

## 2024-06-15 DIAGNOSIS — E559 Vitamin D deficiency, unspecified: Secondary | ICD-10-CM | POA: Diagnosis not present

## 2024-06-22 DIAGNOSIS — R030 Elevated blood-pressure reading, without diagnosis of hypertension: Secondary | ICD-10-CM | POA: Diagnosis not present

## 2024-06-22 DIAGNOSIS — Z0000 Encounter for general adult medical examination without abnormal findings: Secondary | ICD-10-CM | POA: Diagnosis not present

## 2024-06-22 DIAGNOSIS — E039 Hypothyroidism, unspecified: Secondary | ICD-10-CM | POA: Diagnosis not present

## 2024-09-05 DIAGNOSIS — H52223 Regular astigmatism, bilateral: Secondary | ICD-10-CM | POA: Diagnosis not present

## 2024-09-11 DIAGNOSIS — H25811 Combined forms of age-related cataract, right eye: Secondary | ICD-10-CM | POA: Diagnosis not present

## 2024-09-11 DIAGNOSIS — H2512 Age-related nuclear cataract, left eye: Secondary | ICD-10-CM | POA: Diagnosis not present

## 2024-09-20 DIAGNOSIS — H25811 Combined forms of age-related cataract, right eye: Secondary | ICD-10-CM | POA: Diagnosis not present

## 2024-09-20 DIAGNOSIS — E039 Hypothyroidism, unspecified: Secondary | ICD-10-CM | POA: Diagnosis not present

## 2024-09-26 DIAGNOSIS — E039 Hypothyroidism, unspecified: Secondary | ICD-10-CM | POA: Diagnosis not present

## 2024-09-29 DIAGNOSIS — D225 Melanocytic nevi of trunk: Secondary | ICD-10-CM | POA: Diagnosis not present

## 2024-10-04 DIAGNOSIS — H2512 Age-related nuclear cataract, left eye: Secondary | ICD-10-CM | POA: Diagnosis not present

## 2024-10-04 DIAGNOSIS — H25812 Combined forms of age-related cataract, left eye: Secondary | ICD-10-CM | POA: Diagnosis not present

## 2024-10-04 DIAGNOSIS — E039 Hypothyroidism, unspecified: Secondary | ICD-10-CM | POA: Diagnosis not present
